# Patient Record
Sex: Female | Born: 1995 | Race: White | Hispanic: No | Marital: Single | Smoking: Never smoker
Health system: Southern US, Community
[De-identification: ages and names within clinical notes are randomized; demographics above are authoritative.]

## PROBLEM LIST (undated history)

## (undated) DIAGNOSIS — F329 Major depressive disorder, single episode, unspecified: Secondary | ICD-10-CM

## (undated) DIAGNOSIS — F32A Depression, unspecified: Secondary | ICD-10-CM

## (undated) DIAGNOSIS — G2581 Restless legs syndrome: Secondary | ICD-10-CM

## (undated) DIAGNOSIS — Z23 Encounter for immunization: Secondary | ICD-10-CM

## (undated) DIAGNOSIS — G90A Postural orthostatic tachycardia syndrome (POTS): Secondary | ICD-10-CM

## (undated) DIAGNOSIS — T8859XA Other complications of anesthesia, initial encounter: Secondary | ICD-10-CM

## (undated) DIAGNOSIS — R51 Headache: Secondary | ICD-10-CM

## (undated) DIAGNOSIS — R519 Headache, unspecified: Secondary | ICD-10-CM

## (undated) DIAGNOSIS — N12 Tubulo-interstitial nephritis, not specified as acute or chronic: Secondary | ICD-10-CM

## (undated) HISTORY — DX: Headache, unspecified: R51.9

## (undated) HISTORY — DX: Postural orthostatic tachycardia syndrome (POTS): G90.A

## (undated) HISTORY — DX: Headache: R51

## (undated) HISTORY — PX: NOSE SURGERY: SHX723

## (undated) HISTORY — DX: Other complications of anesthesia, initial encounter: T88.59XA

---
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
## 2015-03-23 ENCOUNTER — Encounter (HOSPITAL_COMMUNITY): Payer: Self-pay | Admitting: *Deleted

## 2015-03-23 ENCOUNTER — Inpatient Hospital Stay
Admission: AD | Admit: 2015-03-23 | Discharge: 2015-03-25 | DRG: 775 | Disposition: A | Discharge status: Home / Self Care | Payer: Medicaid Other | Source: Ambulatory Visit | Attending: Obstetrics & Gynecology | Admitting: Obstetrics & Gynecology

## 2015-03-23 DIAGNOSIS — IMO0002 Reserved for concepts with insufficient information to code with codable children: Secondary | ICD-10-CM | POA: Diagnosis present

## 2015-03-23 DIAGNOSIS — Z3A40 40 weeks gestation of pregnancy: Secondary | ICD-10-CM | POA: Diagnosis present

## 2015-03-23 DIAGNOSIS — O99344 Other mental disorders complicating childbirth: Secondary | ICD-10-CM | POA: Diagnosis present

## 2015-03-23 DIAGNOSIS — O9081 Anemia of the puerperium: Secondary | ICD-10-CM | POA: Diagnosis present

## 2015-03-23 DIAGNOSIS — F329 Major depressive disorder, single episode, unspecified: Secondary | ICD-10-CM | POA: Diagnosis present

## 2015-03-23 DIAGNOSIS — O480 Post-term pregnancy: Principal | ICD-10-CM | POA: Diagnosis present

## 2015-03-23 DIAGNOSIS — Z3403 Encounter for supervision of normal first pregnancy, third trimester: Secondary | ICD-10-CM | POA: Diagnosis present

## 2015-03-23 DIAGNOSIS — D649 Anemia, unspecified: Secondary | ICD-10-CM

## 2015-03-23 DIAGNOSIS — Z8659 Personal history of other mental and behavioral disorders: Secondary | ICD-10-CM

## 2015-03-23 HISTORY — DX: Major depressive disorder, single episode, unspecified: F32.9

## 2015-03-23 HISTORY — DX: Depression, unspecified: F32.A

## 2015-03-23 HISTORY — DX: Encounter for immunization: Z23

## 2015-03-23 HISTORY — DX: Tubulo-interstitial nephritis, not specified as acute or chronic: N12

## 2015-03-23 HISTORY — DX: Restless legs syndrome: G25.81

## 2015-03-23 LAB — TYPE AND SCREEN
ABO/RH(D): O POS
Antibody Screen: NEGATIVE

## 2015-03-23 LAB — CBC
HEMATOCRIT: 34.9 % — AB (ref 36.0–46.0)
Hemoglobin: 11.4 g/dL — ABNORMAL LOW (ref 12.0–15.0)
MCH: 26 pg (ref 26.0–34.0)
MCHC: 32.7 g/dL (ref 30.0–36.0)
MCV: 79.7 fL (ref 78.0–100.0)
PLATELETS: 270 10*3/uL (ref 150–400)
RBC: 4.38 MIL/uL (ref 3.87–5.11)
RDW: 14.5 % (ref 11.5–15.5)
WBC: 14.7 10*3/uL — ABNORMAL HIGH (ref 4.0–10.5)

## 2015-03-23 LAB — ABO/RH: ABO/RH(D): O POS

## 2015-03-23 LAB — OB RESULTS CONSOLE GBS: STREP GROUP B AG: NEGATIVE

## 2015-03-23 MED ORDER — ONDANSETRON HCL 4 MG/2ML IJ SOLN: 4.0000 mg | Freq: Four times a day (QID) | INTRAMUSCULAR | Status: DC | PRN

## 2015-03-23 MED ORDER — IBUPROFEN 600 MG PO TABS
600.0000 mg | ORAL_TABLET | Freq: Four times a day (QID) | ORAL | Status: DC
  Administered 2015-03-24 – 2015-03-25 (×6): 600 mg via ORAL
  Filled 2015-03-23 (×6): qty 1

## 2015-03-23 MED ORDER — ACETAMINOPHEN 325 MG PO TABS: 650.0000 mg | ORAL_TABLET | ORAL | Status: DC | PRN

## 2015-03-23 MED ORDER — LANOLIN HYDROUS EX OINT: TOPICAL_OINTMENT | CUTANEOUS | Status: DC | PRN

## 2015-03-23 MED ORDER — SENNOSIDES-DOCUSATE SODIUM 8.6-50 MG PO TABS
2.0000 | ORAL_TABLET | ORAL | Status: DC
  Administered 2015-03-24 (×2): 2 via ORAL
  Filled 2015-03-23 (×3): qty 2

## 2015-03-23 MED ORDER — DIPHENHYDRAMINE HCL 25 MG PO CAPS
25.0000 mg | ORAL_CAPSULE | Freq: Four times a day (QID) | ORAL | Status: DC | PRN
Start: 2015-03-23 — End: 2015-03-25

## 2015-03-23 MED ORDER — OXYTOCIN 40 UNITS IN LACTATED RINGERS INFUSION - SIMPLE MED
62.5000 mL/h | INTRAVENOUS | Status: DC
  Administered 2015-03-23: 62.5 mL/h via INTRAVENOUS
  Filled 2015-03-23: qty 1000

## 2015-03-23 MED ORDER — LIDOCAINE HCL (PF) 1 % IJ SOLN
30.0000 mL | INTRAMUSCULAR | Status: DC | PRN
  Administered 2015-03-23: 30 mL via SUBCUTANEOUS
  Filled 2015-03-23: qty 30

## 2015-03-23 MED ORDER — BUTORPHANOL TARTRATE 1 MG/ML IJ SOLN
1.0000 mg | INTRAMUSCULAR | Status: DC | PRN
  Administered 2015-03-23: 1 mg via INTRAVENOUS
  Filled 2015-03-23: qty 1

## 2015-03-23 MED ORDER — SODIUM CHLORIDE 0.9 % IV SOLN: 250.0000 mL | INTRAVENOUS | Status: DC | PRN

## 2015-03-23 MED ORDER — WITCH HAZEL-GLYCERIN EX PADS: 1.0000 "application " | MEDICATED_PAD | CUTANEOUS | Status: DC | PRN

## 2015-03-23 MED ORDER — OXYTOCIN BOLUS FROM INFUSION: 500.0000 mL | INTRAVENOUS | Status: DC

## 2015-03-23 MED ORDER — ZOLPIDEM TARTRATE 5 MG PO TABS: 5.0000 mg | ORAL_TABLET | Freq: Every evening | ORAL | Status: DC | PRN

## 2015-03-23 MED ORDER — OXYCODONE-ACETAMINOPHEN 5-325 MG PO TABS: 2.0000 | ORAL_TABLET | ORAL | Status: DC | PRN

## 2015-03-23 MED ORDER — TETANUS-DIPHTH-ACELL PERTUSSIS 5-2.5-18.5 LF-MCG/0.5 IM SUSP: 0.5000 mL | Freq: Once | INTRAMUSCULAR | Status: DC

## 2015-03-23 MED ORDER — SIMETHICONE 80 MG PO CHEW: 80.0000 mg | CHEWABLE_TABLET | ORAL | Status: DC | PRN

## 2015-03-23 MED ORDER — FLEET ENEMA 7-19 GM/118ML RE ENEM: 1.0000 | ENEMA | RECTAL | Status: DC | PRN

## 2015-03-23 MED ORDER — OXYCODONE-ACETAMINOPHEN 5-325 MG PO TABS
1.0000 | ORAL_TABLET | ORAL | Status: DC | PRN
  Administered 2015-03-24 (×2): 1 via ORAL
  Filled 2015-03-23 (×3): qty 1

## 2015-03-23 MED ORDER — LACTATED RINGERS IV SOLN: INTRAVENOUS | Status: DC

## 2015-03-23 MED ORDER — SODIUM CHLORIDE 0.9 % IJ SOLN: 3.0000 mL | Freq: Two times a day (BID) | INTRAMUSCULAR | Status: DC

## 2015-03-23 MED ORDER — ONDANSETRON HCL 4 MG PO TABS: 4.0000 mg | ORAL_TABLET | ORAL | Status: DC | PRN

## 2015-03-23 MED ORDER — LACTATED RINGERS IV SOLN: 500.0000 mL | INTRAVENOUS | Status: DC | PRN

## 2015-03-23 MED ORDER — SODIUM CHLORIDE 0.9 % IJ SOLN: 3.0000 mL | INTRAMUSCULAR | Status: DC | PRN

## 2015-03-23 MED ORDER — BENZOCAINE-MENTHOL 20-0.5 % EX AERO
1.0000 "application " | INHALATION_SPRAY | CUTANEOUS | Status: DC | PRN
  Administered 2015-03-24: 1 via TOPICAL
  Filled 2015-03-23: qty 56

## 2015-03-23 MED ORDER — CITRIC ACID-SODIUM CITRATE 334-500 MG/5ML PO SOLN: 30.0000 mL | ORAL | Status: DC | PRN

## 2015-03-23 MED ORDER — ONDANSETRON HCL 4 MG/2ML IJ SOLN: 4.0000 mg | INTRAMUSCULAR | Status: DC | PRN

## 2015-03-23 MED ORDER — DIBUCAINE 1 % RE OINT: 1.0000 "application " | TOPICAL_OINTMENT | RECTAL | Status: DC | PRN

## 2015-03-23 MED ORDER — OXYCODONE-ACETAMINOPHEN 5-325 MG PO TABS: 1.0000 | ORAL_TABLET | ORAL | Status: DC | PRN

## 2015-03-23 MED ORDER — PRENATAL MULTIVITAMIN CH
1.0000 | ORAL_TABLET | Freq: Every day | ORAL | Status: DC
  Administered 2015-03-24: 1 via ORAL
  Filled 2015-03-23: qty 1

## 2015-03-23 NOTE — Progress Notes (Signed)
Subjective: Slept at intervals since Stadol at 1408--still feels very tired, UCs still painful.  FOB, his mom, and patient's mom at bedside.  Tub inflated, not filled yet.  Objective: BP 126/76 mmHg  Pulse 83  Temp(Src) 98.3 F (36.8 C) (Oral)  Resp 18  Ht 5\' 4"  (1.626 m)  Wt 144 lb (65.318 kg)  BMI 24.71 kg/m2  SpO2 100%  LMP 04/16/2014      FHT: Category 1 UC:   regular, every 5 minutes SVE:   Dilation: 7 Effacement (%): 100 Station: 0 Exam by:: V. Syris Brookens,CNM  BBOW  Assessment:  Active labor Planning use of waterbirth tub   Plan: Discussed option of AROM--patient coping well with UCs, and labor progress is adequate.   Will recheck cervix within 2 hours--if no progress, will plan AROM.  Will observe at present. Family filling tub for patient use.  Nigel BridgemanLATHAM, Letita Prentiss CNM, [place] 03/23/2015, 4:28 PM

## 2015-03-23 NOTE — H&P (Signed)
Gina [name] is a 19 y.o. female, G1P0 at 4740 1/7 weeks, presenting for UCs q 5 min since 0430, with contractions q 10-15 min for "several days".  Denies leaking or bleeding, reports +FM.  Cervix was 1 cm, 80%, vtx, -2 on 03/21/15.    She was evaluated in MAU after arrival, with cervix initially 2 cm, 80%, vtx, -1.  NST reactive, with UCs q 5 min.  Patient was observed for approx 1 hour, with recheck of cervix as 3-4, 90%, vtx, -1, with slightly BBOW.  Patient is very fatigued, with minimal sleep over last 2 days.  She is accompanied by her partner and her mother.  Patient does hope to use waterbirth tub for labor and/or delivery.  She attended class and signed consents x 2 on 03/09/15.  Patient Active Problem List   Diagnosis Date Noted  . Normal labor 03/23/2015  . Teen pregnancy 03/23/2015  . H/O: depression--dx 2015 03/23/2015  . Headache in pregnancy 10/21/2014    History of present pregnancy: Patient entered care at 8 2/7 weeks.   EDC of 03/22/15 was established by US at 8 2/7 weeks.   Anatomy scan:  18 3/7 weeks, with normal findings and an posterior fundal placenta.   Additional US evaluations:   38 6/7 weeks due to S<D:  EFW 3019 gm, 6+14, 25.6%ile, AFI 13.89, 50%ile, vtx. Significant prenatal events:  URI sx at 10 weeks.  EDC established by 8 2/7 week US.  Some weakness and fatigue at 14 3/7 weeks, with HA.  Recommended to take Ibuprophen and Magnesium.  Neuro referral planned around 16 weeks due to persistence of HAs, but patient did not keep appt.  Normal quad screen.  Hgb 10.9 at 28 weeks.  Cold sx at 35 weeks, with fatigue.  Attended WB class, signed consents x 2 on 03/09/15. Last evaluation:  03/21/15--cervix 1 cm, 80%, vtx, -2, discussed induction.  Patient hoped to avoid induction, scheduled for BPP/AFI and ROB for postdates.  Had UCs q 10-15 min for several days at that point.  OB History    Gravida Para Term Preterm AB TAB SAB Ectopic Multiple Living   1         0     Past  Medical History  Diagnosis Date  . Headache   . Restless leg syndrome   . Depression   . Pyelonephritis   . Need for varicella vaccine   History reviewed. No pertinent past surgical history.   Family History: family history includes Anxiety disorder in her mother. There is no history of Migraines.   Social History:  reports that she has never smoked. She has never used smokeless tobacco. She reports that she does not drink alcohol or use illicit drugs.  Patient is Caucasian, of the [place] faith, completing high school.  FOB is involved and supportive, with patient's mother also present and supportive.   Prenatal Transfer Tool  Maternal Diabetes: No Genetic Screening: Normal Quad screen Maternal Ultrasounds/Referrals: Normal Fetal Ultrasounds or other Referrals:  None Maternal Substance Abuse:  No Significant Maternal Medications:  None Significant Maternal Lab Results: Lab values include: Group B Strep negative  TDAP NA Flu NA  ROS:  Contractions, mucus d/c, +FM  No Known Allergies   Dilation: 3.5 Effacement (%): 80 Exam by:: Manfred ArchV. Marvelene Stoneberg, CNM Blood pressure 109/72, pulse 90, temperature 98.4 F (36.9 C), temperature source Oral, resp. rate 18, height 5\' 4"  (1.626 m), weight 144 lb (65.318 kg), last menstrual period 04/16/2014, SpO2 100 %.  Appears very fatigued, lips dry. Chest clear Heart RRR without murmur Abd gravid, NT, FH 38 cm Pelvic: As above, BBOW Ext: WNL  FHR: Category 1 UCs:  q 5 min, mild/moderate  Prenatal labs: ABO, Rh: O/Positive/-- (08/13 0000)O+ Antibody: Negative (08/13 0000)Neg Rubella:   Immune RPR: Nonreactive (08/13 0000) NR HBsAg: Negative (08/13 0000) Neg HIV: Non-reactive (08/13 0000) NR GBS: Negative (04/06 0000) Sickle cell/Hgb electrophoresis:  NA Pap:  NA GC:  Negative 07/30/95, 09/15/14, 02/21/15 Chlamydia:  Negative 07/30/95, 09/15/14, 02/21/15. Genetic screenings:  Normal Quad screen Glucola:  WNL Other:   Hgb at 13.7 at NOB, 10.9   at 28 weeks Varicella non-immune TSH WNL Urine culture 100K mixed species 07/29/14.    Assessment/Plan: IUP at 40 1/7 weeks Early labor, with prolonged prodromal labor GBS negative Desires use of waterbirth tub for labor/birth--consents x 2 in [place] chart Hx depression Hx frequent HAs  Plan: Admit to [facility] per consult with Dr. Sallye Ober Routine [facility] orders OK for use of waterbirth tub Intermittent monitoring Reviewed issue of prolonged prodromal labor, with significant fatigue.  Offered pain med for rest, patient is agreeable with that plan. Saline lock IV, with hydration during pain med administration. Pain med/epidural prn Tub use as labor advances Pain med/epidural prn as desired.   Teiara Baria, VICKICNM, [place] 03/23/2015, 2:14 PM

## 2015-03-23 NOTE — Progress Notes (Signed)
Delivery of live viable female by V. Emilee HeroLatham, CNM APGARs [phone]

## 2015-03-23 NOTE — MAU Note (Signed)
Has been contracting for 4 days, kicked in and regular since 0430 this morning.  No bleeding or leaking.  Was 1 cm when last checked. No problems with preg.

## 2015-03-24 LAB — RPR: RPR Ser Ql: NONREACTIVE

## 2015-03-24 LAB — CBC
HEMATOCRIT: 30 % — AB (ref 36.0–46.0)
Hemoglobin: 9.8 g/dL — ABNORMAL LOW (ref 12.0–15.0)
MCH: 25.8 pg — ABNORMAL LOW (ref 26.0–34.0)
MCHC: 32.7 g/dL (ref 30.0–36.0)
MCV: 78.9 fL (ref 78.0–100.0)
Platelets: 247 10*3/uL (ref 150–400)
RBC: 3.8 MIL/uL — ABNORMAL LOW (ref 3.87–5.11)
RDW: 14.6 % (ref 11.5–15.5)
WBC: 13.3 10*3/uL — AB (ref 4.0–10.5)

## 2015-03-24 LAB — HIV ANTIBODY (ROUTINE TESTING W REFLEX): HIV Screen 4th Generation wRfx: NONREACTIVE

## 2015-03-24 MED ORDER — FERROUS SULFATE 325 (65 FE) MG PO TABS
325.0000 mg | ORAL_TABLET | Freq: Every day | ORAL | Status: DC
  Administered 2015-03-24 – 2015-03-25 (×2): 325 mg via ORAL
  Filled 2015-03-24 (×2): qty 1

## 2015-03-24 NOTE — Progress Notes (Signed)
Subjective: Postpartum Day 1: Vaginal delivery, 2nd degree perineal laceration; left periurethral laceration, hemostatic, no repair required. Patient up ad lib, reports no syncope or dizziness. Feeding:  Breast Contraceptive plan:  Micronor  Objective: Vital signs in last 24 hours: Temp:  [98.3 F (36.8 C)-99.7 F (37.6 C)] 98.3 F (36.8 C) (04/07 0624) Pulse Rate:  [74-115] 74 (04/07 0641) Resp:  [18-20] 20 (04/07 0624) BP: (88-133)/(54-85) 106/64 mmHg (04/07 0641) SpO2:  [99 %-100 %] 100 % (04/06 1114) Weight:  [144 lb (65.318 kg)] 144 lb (65.318 kg) (04/06 1340)   Orthostatics stable.  Physical Exam:  General: alert Lochia: appropriate Uterine Fundus: firm Perineum: healing well DVT Evaluation: No evidence of DVT seen on physical exam. Negative Homan's sign.  CBC Latest Ref Rng 03/24/2015 03/23/2015 12/04/2013  WBC 4.0 - 10.5 K/uL 13.3(H) 14.7(H) 6.2  Hemoglobin 12.0 - 15.0 g/dL 1.6(X9.8(L) 11.4(L) 12.8  Hematocrit 36.0 - 46.0 % 30.0(L) 34.9(L) 41.2  Platelets 150 - 400 K/uL 247 270 -    Assessment/Plan: Status post vaginal delivery day 1. Mild anemia without hemodynamic instability Stable Continue current care. Fe q day. Plan for discharge tomorrow    Nyra CapesLATHAM, VICKICNM 03/24/2015, 8:11 AM

## 2015-03-24 NOTE — Progress Notes (Signed)
UR chart review completed.  

## 2015-03-24 NOTE — Progress Notes (Signed)
Clinical Social Work Department PSYCHOSOCIAL ASSESSMENT - MATERNAL/CHILD 03/24/2015  Patient:  Gina [name], Gina [name]  Account Number:  [id number]  Admit Date:  03/23/2015  Ardine Eng Name:   [place] Worker:  Lucita Ferrara, CLINICAL SOCIAL WORKER   Date/Time:  03/24/2015 11:15 AM  Date Referred:  03/23/2015   Referral source  Central Nursery     Referred reason  Depression/Anxiety   Other referral source:    I:  FAMILY / [place] legal guardian:  PARENT  Guardian - Name Guardian - Age Guardian - Address  [address] [place], [place] [address]  Ewing Schlein  same as above   Other household support members/support persons Other support:   MOB and FOB endorsed having numerous family members that live within close proximity of their apartment.   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   Financial resources:  Self Pay If Medicaid - County:  [place] Other  [place] / Grade:  N/A Maternity Glass blower/designer / Child Services Coordination / Early Interventions:   None reported  Cultural issues impacting care:   None reported    III  STRENGTHS Strengths  Adequate Resources  Home prepared for Child (including basic supplies)  Supportive family/friends   Strength comment:    IV  RISK FACTORS AND CURRENT PROBLEMS Current Problem:  YES   Risk Factor & Current Problem Patient Issue Family Issue Risk Factor / Current Problem Comment  Mental Illness Y N MOB presents with history of depression since childhood. MOB stated that she sough out treatment 2 years ago, but stopped taking medications shortly afterwards due to "not liking it". MOB denied any symptoms pre-pregnancy or during pregnancy.  Adjustment to Motherhood Y N MOB continues to adjust to life transition of  becoming a mother at a young age.    V  SOCIAL WORK ASSESSMENT CSW met with MOB due to history of depression.  MOB presented as easily engaged and receptive to the visit. The FOB and the PGM were also present for the visit with MOB's consent.  MOB displayed an appropriate range in affect and was in a pleasant mood.  CSW unable to assess for infant bonding since PGM was holding the infant, but MOB was noted to be smiling as she discussed how she feels as she transitions to becoming a mother.   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  She discussed belief that this previous experience has helped her to increase confidence/comfort with caring for an infant.  She shared that she also has positive supports at home (identified numerous family members) who she can call for help if needed.  MOB discussed that she and the FOB have their home prepared for the infant and are looking forward to the transition to parenthood.  She shared that despite mentally preparing for the entire pregnancy, it continues to feel surreal that the infant has been born.  CSW normalized and continued to assist the MOB explore feelings that accompany the transition to the motherhood.  MOB shared that she is supported by her employer (has maternity leave), and stated that she will be taking online classes this semester. She stated that she is concerned about her transition back to face-to-face classes this summer.  Per MOB, she knows that the infant will be well taken care of, but she would rather remain with the infant. She presented as receptive as CSW explore the temporal nature of these feelings and discussing how the separation during class is "brief" in comparison to the big picture of the infant's life.   MOB acknowledged history of depression.  She presents with insight on symptoms and awareness of reasons to seek out treatment.  MOB stated that she had  depression/anxiety since childhood.  She stated that she finally "sought out" help 2 years ago since she had low energy, low motivation, and was emotional or numb to feelings.  She stated that she was prescribed an anti-depressant (unable to recall name) but did not find it to be effective.  She discussed that she then moved out of her parents' home, started to talk to others about her feelings, and listened to music in order to help her to reduce depression.  MOB denied any symptoms since then, and denied any mood symptoms during the pregnancy.  She presented as receptive to exploring how these previous learned coping skills can be applied to the postpartum period.  She expressed confidence in her ability to reach out to medical providers if needed in the future, and was able to articulate numerous symptoms that would cause her to reach out to providers.  She acknowledged education on postpartum depression, and verbalized understanding of the spectrum of symptoms that may occur.  She agreed to contact her doctor if she notes symptoms.  MOB did not present as nervous or overwhelmed as she becomes a mother.  She also did not indicate a belief that her mental health is a chronic/present problem.   MOB and family verbalized appreciation for the visit. They denied questions or concerns at this time.  MOB agreed to contact CSW if needs arise.   VI SOCIAL WORK PLAN Social Work Therapist, art  No Further Intervention Required / No Barriers to Discharge   Type of pt/family education:   Postpartum depression   If child protective services report - county:  N/A If child protective services report - date:  N/A Information/referral to community resources comment:   MOB denied current needs.   Other social work plan:   CSW to follow up PRN.

## 2015-03-24 NOTE — Lactation Note (Signed)
This note was copied from the chart of Gina Chemical engineerVaeda Chittum. Lactation Consultation Note Baby sleeping soundly. Mom resting. Stating baby not getting deep latch.  Mom has small nipples, very compressible. Mom encouraged to feed baby 8-12 times/24 hours and with feeding cues. Referred to Baby and Me Book in Breastfeeding section Pg. 22-23 for position options and Proper latch demonstration. Educated about newborn behavior.  Mom encouraged to do skin-to-skin.Encouraged to call for assistance if needed and to verify proper latch.WH/LC brochure given w/resources, support groups and LC services. Discussed positioning, and obtaining deep latch. Patient Name: Gina [name]   HQION'GToday's Date: 03/24/2015 Reason for consult: Initial assessment   Maternal Data    Feeding    LATCH Score/Interventions       Type of Nipple: Everted at rest and after stimulation  Comfort (Breast/Nipple): Filling, red/small blisters or bruises, mild/mod discomfort  Problem noted: Mild/Moderate discomfort Interventions (Mild/moderate discomfort): Hand massage;Hand expression        Lactation Tools Discussed/Used     Consult Status Consult Status: Follow-up Date: 03/24/15 (in pm) Follow-up type: In-patient    Charyl DancerCARVER, Nohelia Valenza G 03/24/2015, 6:41 AM

## 2015-03-25 DIAGNOSIS — D649 Anemia, unspecified: Secondary | ICD-10-CM

## 2015-03-25 MED ORDER — IBUPROFEN 600 MG PO TABS: 600.0000 mg | ORAL_TABLET | Freq: Four times a day (QID) | ORAL | Status: DC

## 2015-03-25 MED ORDER — NORETHINDRONE 0.35 MG PO TABS: 1.0000 | ORAL_TABLET | Freq: Every day | ORAL | Status: DC

## 2015-03-25 MED ORDER — FERROUS SULFATE 325 (65 FE) MG PO TABS: 325.0000 mg | ORAL_TABLET | Freq: Every day | ORAL | Status: DC

## 2015-03-25 MED ORDER — OXYCODONE-ACETAMINOPHEN 5-325 MG PO TABS
1.0000 | ORAL_TABLET | ORAL | Status: DC | PRN
Start: 2015-03-25 — End: 2015-06-22

## 2015-03-25 NOTE — Discharge Instructions (Signed)
Contraception Choices Contraception (birth control) is the use of any methods or devices to prevent pregnancy. Below are some methods to help avoid pregnancy. HORMONAL METHODS   Contraceptive implant. This is a thin, plastic tube containing progesterone hormone. It does not contain estrogen hormone. Your health care provider inserts the tube in the inner part of the upper arm. The tube can remain in place for up to 3 years. After 3 years, the implant must be removed. The implant prevents the ovaries from releasing an egg (ovulation), thickens the cervical mucus to prevent sperm from entering the uterus, and thins the lining of the inside of the uterus.  Progesterone-only injections. These injections are given every 3 months by your health care provider to prevent pregnancy. This synthetic progesterone hormone stops the ovaries from releasing eggs. It also thickens cervical mucus and changes the uterine lining. This makes it harder for sperm to survive in the uterus.  Birth control pills. These pills contain estrogen and progesterone hormone. They work by preventing the ovaries from releasing eggs (ovulation). They also cause the cervical mucus to thicken, preventing the sperm from entering the uterus. Birth control pills are prescribed by a health care provider.Birth control pills can also be used to treat heavy periods.  Minipill. This type of birth control pill contains only the progesterone hormone. They are taken every day of each month and must be prescribed by your health care provider.  Birth control patch. The patch contains hormones similar to those in birth control pills. It must be changed once a week and is prescribed by a health care provider.  Vaginal ring. The ring contains hormones similar to those in birth control pills. It is left in the vagina for 3 weeks, removed for 1 week, and then a new one is put back in place. The patient must be comfortable inserting and removing the ring  from the vagina.A health care provider's prescription is necessary.  Emergency contraception. Emergency contraceptives prevent pregnancy after unprotected sexual intercourse. This pill can be taken right after sex or up to 5 days after unprotected sex. It is most effective the sooner you take the pills after having sexual intercourse. Most emergency contraceptive pills are available without a prescription. Check with your pharmacist. Do not use emergency contraception as your only form of birth control. BARRIER METHODS   Female condom. This is a thin sheath (latex or rubber) that is worn over the penis during sexual intercourse. It can be used with spermicide to increase effectiveness.  Female condom. This is a soft, loose-fitting sheath that is put into the vagina before sexual intercourse.  Diaphragm. This is a soft, latex, dome-shaped barrier that must be fitted by a health care provider. It is inserted into the vagina, along with a spermicidal jelly. It is inserted before intercourse. The diaphragm should be left in the vagina for 6 to 8 hours after intercourse.  Cervical cap. This is a round, soft, latex or plastic cup that fits over the cervix and must be fitted by a health care provider. The cap can be left in place for up to 48 hours after intercourse.  Sponge. This is a soft, circular piece of polyurethane foam. The sponge has spermicide in it. It is inserted into the vagina after wetting it and before sexual intercourse.  Spermicides. These are chemicals that kill or block sperm from entering the cervix and uterus. They come in the form of creams, jellies, suppositories, foam, or tablets. They do not require a  prescription. They are inserted into the vagina with an applicator before having sexual intercourse. The process must be repeated every time you have sexual intercourse. INTRAUTERINE CONTRACEPTION  Intrauterine device (IUD). This is a T-shaped device that is put in a woman's uterus  during a menstrual period to prevent pregnancy. There are 2 types:  Copper IUD. This type of IUD is wrapped in copper wire and is placed inside the uterus. Copper makes the uterus and fallopian tubes produce a fluid that kills sperm. It can stay in place for 10 years.  Hormone IUD. This type of IUD contains the hormone progestin (synthetic progesterone). The hormone thickens the cervical mucus and prevents sperm from entering the uterus, and it also thins the uterine lining to prevent implantation of a fertilized egg. The hormone can weaken or kill the sperm that get into the uterus. It can stay in place for 3-5 years, depending on which type of IUD is used. PERMANENT METHODS OF CONTRACEPTION  Female tubal ligation. This is when the woman's fallopian tubes are surgically sealed, tied, or blocked to prevent the egg from traveling to the uterus.  Hysteroscopic sterilization. This involves placing a small coil or insert into each fallopian tube. Your doctor uses a technique called hysteroscopy to do the procedure. The device causes scar tissue to form. This results in permanent blockage of the fallopian tubes, so the sperm cannot fertilize the egg. It takes about 3 months after the procedure for the tubes to become blocked. You must use another form of birth control for these 3 months.  Female sterilization. This is when the female has the tubes that carry sperm tied off (vasectomy).This blocks sperm from entering the vagina during sexual intercourse. After the procedure, the man can still ejaculate fluid (semen). NATURAL PLANNING METHODS  Natural family planning. This is not having sexual intercourse or using a barrier method (condom, diaphragm, cervical cap) on days the woman could become pregnant.  Calendar method. This is keeping track of the length of each menstrual cycle and identifying when you are fertile.  Ovulation method. This is avoiding sexual intercourse during ovulation.  Symptothermal  method. This is avoiding sexual intercourse during ovulation, using a thermometer and ovulation symptoms.  Post-ovulation method. This is timing sexual intercourse after you have ovulated. Regardless of which type or method of contraception you choose, it is important that you use condoms to protect against the transmission of sexually transmitted infections (STIs). Talk with your health care provider about which form of contraception is most appropriate for you. Document Released: 12/03/2005 Document Revised: 12/08/2013 Document Reviewed: 05/28/2013 [facility] Patient Information 2015 [place], [place]. This information is not intended to replace advice given to you by your health care provider. Make sure you discuss any questions you have with your health care provider. Breastfeeding Deciding to breastfeed is one of the best choices you can make for you and your baby. A change in hormones during pregnancy causes your breast tissue to grow and increases the number and size of your milk ducts. These hormones also allow proteins, sugars, and fats from your blood supply to make breast milk in your milk-producing glands. Hormones prevent breast milk from being released before your baby is born as well as prompt milk flow after birth. Once breastfeeding has begun, thoughts of your baby, as well as his or her sucking or crying, can stimulate the release of milk from your milk-producing glands.  BENEFITS OF BREASTFEEDING For Your Baby  Your first milk (colostrum) helps your  baby's digestive system function better.   There are antibodies in your milk that help your baby fight off infections.   Your baby has a lower incidence of asthma, allergies, and sudden infant death syndrome.   The nutrients in breast milk are better for your baby than infant formulas and are designed uniquely for your baby's needs.   Breast milk improves your baby's brain development.   Your baby is less likely to develop other  conditions, such as childhood obesity, asthma, or type 2 diabetes mellitus.  For You   Breastfeeding helps to create a very special bond between you and your baby.   Breastfeeding is convenient. Breast milk is always available at the correct temperature and costs nothing.   Breastfeeding helps to burn calories and helps you lose the weight gained during pregnancy.   Breastfeeding makes your uterus contract to its prepregnancy size faster and slows bleeding (lochia) after you give birth.   Breastfeeding helps to lower your risk of developing type 2 diabetes mellitus, osteoporosis, and breast or ovarian cancer later in life. SIGNS THAT YOUR BABY IS HUNGRY Early Signs of Hunger  Increased alertness or activity.  Stretching.  Movement of the head from side to side.  Movement of the head and opening of the mouth when the corner of the mouth or cheek is stroked (rooting).  Increased sucking sounds, smacking lips, cooing, sighing, or squeaking.  Hand-to-mouth movements.  Increased sucking of fingers or hands. Late Signs of Hunger  Fussing.  Intermittent crying. Extreme Signs of Hunger Signs of extreme hunger will require calming and consoling before your baby will be able to breastfeed successfully. Do not wait for the following signs of extreme hunger to occur before you initiate breastfeeding:   Restlessness.  A loud, strong cry.   Screaming. BREASTFEEDING BASICS Breastfeeding Initiation  Find a comfortable place to sit or lie down, with your neck and back well supported.  Place a pillow or rolled up blanket under your baby to bring him or her to the level of your breast (if you are seated). Nursing pillows are specially designed to help support your arms and your baby while you breastfeed.  Make sure that your baby's abdomen is facing your abdomen.   Gently massage your breast. With your fingertips, massage from your chest wall toward your nipple in a circular  motion. This encourages milk flow. You may need to continue this action during the feeding if your milk flows slowly.  Support your breast with 4 fingers underneath and your thumb above your nipple. Make sure your fingers are well away from your nipple and your baby's mouth.   Stroke your baby's lips gently with your finger or nipple.   When your baby's mouth is open wide enough, quickly bring your baby to your breast, placing your entire nipple and as much of the colored area around your nipple (areola) as possible into your baby's mouth.   More areola should be visible above your baby's upper lip than below the lower lip.   Your baby's tongue should be between his or her lower gum and your breast.   Ensure that your baby's mouth is correctly positioned around your nipple (latched). Your baby's lips should create a seal on your breast and be turned out (everted).  It is common for your baby to suck about 2-3 minutes in order to start the flow of breast milk. Latching Teaching your baby how to latch on to your breast properly is very important.  An improper latch can cause nipple pain and decreased milk supply for you and poor weight gain in your baby. Also, if your baby is not latched onto your nipple properly, he or she may swallow some air during feeding. This can make your baby fussy. Burping your baby when you switch breasts during the feeding can help to get rid of the air. However, teaching your baby to latch on properly is still the best way to prevent fussiness from swallowing air while breastfeeding. Signs that your baby has successfully latched on to your nipple:    Silent tugging or silent sucking, without causing you pain.   Swallowing heard between every 3-4 sucks.    Muscle movement above and in front of his or her ears while sucking.  Signs that your baby has not successfully latched on to nipple:   Sucking sounds or smacking sounds from your baby while  breastfeeding.  Nipple pain. If you think your baby has not latched on correctly, slip your finger into the corner of your baby's mouth to break the suction and place it between your baby's gums. Attempt breastfeeding initiation again. Signs of Successful Breastfeeding Signs from your baby:   A gradual decrease in the number of sucks or complete cessation of sucking.   Falling asleep.   Relaxation of his or her body.   Retention of a small amount of milk in his or her mouth.   Letting go of your breast by himself or herself. Signs from you:  Breasts that have increased in firmness, weight, and size 1-3 hours after feeding.   Breasts that are softer immediately after breastfeeding.  Increased milk volume, as well as a change in milk consistency and color by the fifth day of breastfeeding.   Nipples that are not sore, cracked, or bleeding. Signs That Your Pecola LeisureBaby is Getting Enough Milk  Wetting at least 3 diapers in a 24-hour period. The urine should be clear and pale yellow by age 14 days.  At least 3 stools in a 24-hour period by age 14 days. The stool should be soft and yellow.  At least 3 stools in a 24-hour period by age 96 days. The stool should be seedy and yellow.  No loss of weight greater than 10% of birth weight during the first 723 days of age.  Average weight gain of 4-7 ounces (113-198 g) per week after age 43 days.  Consistent daily weight gain by age 14 days, without weight loss after the age of 2 weeks. After a feeding, your baby may spit up a small amount. This is common. BREASTFEEDING FREQUENCY AND DURATION Frequent feeding will help you make more milk and can prevent sore nipples and breast engorgement. Breastfeed when you feel the need to reduce the fullness of your breasts or when your baby shows signs of hunger. This is called "breastfeeding on demand." Avoid introducing a pacifier to your baby while you are working to establish breastfeeding (the first 4-6  weeks after your baby is born). After this time you may choose to use a pacifier. Research has shown that pacifier use during the first year of a baby's life decreases the risk of sudden infant death syndrome (SIDS). Allow your baby to feed on each breast as long as he or she wants. Breastfeed until your baby is finished feeding. When your baby unlatches or falls asleep while feeding from the first breast, offer the second breast. Because newborns are often sleepy in the first few weeks of  life, you may need to awaken your baby to get him or her to feed. Breastfeeding times will vary from baby to baby. However, the following rules can serve as a guide to help you ensure that your baby is properly fed:  Newborns (babies 46 weeks of age or younger) may breastfeed every 1-3 hours.  Newborns should not go longer than 3 hours during the day or 5 hours during the night without breastfeeding.  You should breastfeed your baby a minimum of 8 times in a 24-hour period until you begin to introduce solid foods to your baby at around 46 months of age. BREAST MILK PUMPING Pumping and storing breast milk allows you to ensure that your baby is exclusively fed your breast milk, even at times when you are unable to breastfeed. This is especially important if you are going back to work while you are still breastfeeding or when you are not able to be present during feedings. Your lactation consultant can give you guidelines on how long it is safe to store breast milk.  A breast pump is a machine that allows you to pump milk from your breast into a sterile bottle. The pumped breast milk can then be stored in a refrigerator or freezer. Some breast pumps are operated by hand, while others use electricity. Ask your lactation consultant which type will work best for you. Breast pumps can be purchased, but some hospitals and breastfeeding support groups lease breast pumps on a monthly basis. A lactation consultant can teach you how  to hand express breast milk, if you prefer not to use a pump.  CARING FOR YOUR BREASTS WHILE YOU BREASTFEED Nipples can become dry, cracked, and sore while breastfeeding. The following recommendations can help keep your breasts moisturized and healthy:  Avoid using soap on your nipples.   Wear a supportive bra. Although not required, special nursing bras and tank tops are designed to allow access to your breasts for breastfeeding without taking off your entire bra or top. Avoid wearing underwire-style bras or extremely tight bras.  Air dry your nipples for 3-60minutes after each feeding.   Use only cotton bra pads to absorb leaked breast milk. Leaking of breast milk between feedings is normal.   Use lanolin on your nipples after breastfeeding. Lanolin helps to maintain your skin's normal moisture barrier. If you use pure lanolin, you do not need to wash it off before feeding your baby again. Pure lanolin is not toxic to your baby. You may also hand express a few drops of breast milk and gently massage that milk into your nipples and allow the milk to air dry. In the first few weeks after giving birth, some women experience extremely full breasts (engorgement). Engorgement can make your breasts feel heavy, warm, and tender to the touch. Engorgement peaks within 3-5 days after you give birth. The following recommendations can help ease engorgement:  Completely empty your breasts while breastfeeding or pumping. You may want to start by applying warm, moist heat (in the shower or with warm water-soaked hand towels) just before feeding or pumping. This increases circulation and helps the milk flow. If your baby does not completely empty your breasts while breastfeeding, pump any extra milk after he or she is finished.  Wear a snug bra (nursing or regular) or tank top for 1-2 days to signal your body to slightly decrease milk production.  Apply ice packs to your breasts, unless this is too  uncomfortable for you.  Make sure that  your baby is latched on and positioned properly while breastfeeding. If engorgement persists after 48 hours of following these recommendations, contact your health care provider or a Advertising copywriter. OVERALL HEALTH CARE RECOMMENDATIONS WHILE BREASTFEEDING  Eat healthy foods. Alternate between meals and snacks, eating 3 of each per day. Because what you eat affects your breast milk, some of the foods may make your baby more irritable than usual. Avoid eating these foods if you are sure that they are negatively affecting your baby.  Drink milk, fruit juice, and water to satisfy your thirst (about 10 glasses a day).   Rest often, relax, and continue to take your prenatal vitamins to prevent fatigue, stress, and anemia.  Continue breast self-awareness checks.  Avoid chewing and smoking tobacco.  Avoid alcohol and drug use. Some medicines that may be harmful to your baby can pass through breast milk. It is important to ask your health care provider before taking any medicine, including all over-the-counter and prescription medicine as well as vitamin and herbal supplements. It is possible to become pregnant while breastfeeding. If birth control is desired, ask your health care provider about options that will be safe for your baby. SEEK MEDICAL CARE IF:   You feel like you want to stop breastfeeding or have become frustrated with breastfeeding.  You have painful breasts or nipples.  Your nipples are cracked or bleeding.  Your breasts are red, tender, or warm.  You have a swollen area on either breast.  You have a fever or chills.  You have nausea or vomiting.  You have drainage other than breast milk from your nipples.  Your breasts do not become full before feedings by the fifth day after you give birth.  You feel sad and depressed.  Your baby is too sleepy to eat well.  Your baby is having trouble sleeping.   Your baby is wetting  less than 3 diapers in a 24-hour period.  Your baby has less than 3 stools in a 24-hour period.  Your baby's skin or the white part of his or her eyes becomes yellow.   Your baby is not gaining weight by 8 days of age. SEEK IMMEDIATE MEDICAL CARE IF:   Your baby is overly tired (lethargic) and does not want to wake up and feed.  Your baby develops an unexplained fever. Document Released: 12/03/2005 Document Revised: 12/08/2013 Document Reviewed: 05/27/2013 [facility] Patient Information 2015 [place], [place]. This information is not intended to replace advice given to you by your health care provider. Make sure you discuss any questions you have with your health care provider. Anemia, Nonspecific Anemia is a condition in which the concentration of red blood cells or hemoglobin in the blood is below normal. Hemoglobin is a substance in red blood cells that carries oxygen to the tissues of the body. Anemia results in not enough oxygen reaching these tissues.  CAUSES  Common causes of anemia include:   Excessive bleeding. Bleeding may be internal or external. This includes excessive bleeding from periods (in women) or from the intestine.   Poor nutrition.   Chronic kidney, thyroid, and liver disease.  Bone marrow disorders that decrease red blood cell production.  Cancer and treatments for cancer.  HIV, AIDS, and their treatments.  Spleen problems that increase red blood cell destruction.  Blood disorders.  Excess destruction of red blood cells due to infection, medicines, and autoimmune disorders. SIGNS AND SYMPTOMS   Minor weakness.   Dizziness.   Headache.  Palpitations.  Shortness of breath, especially with exercise.   Paleness.  Cold sensitivity.  Indigestion.  Nausea.  Difficulty sleeping.  Difficulty concentrating. Symptoms may occur suddenly or they may develop slowly.  DIAGNOSIS  Additional blood tests are often needed. These help your health  care provider determine the best treatment. Your health care provider will check your stool for blood and look for other causes of blood loss.  TREATMENT  Treatment varies depending on the cause of the anemia. Treatment can include:   Supplements of iron, vitamin B12, or folic acid.   Hormone medicines.   A blood transfusion. This may be needed if blood loss is severe.   Hospitalization. This may be needed if there is significant continual blood loss.   Dietary changes.  Spleen removal. HOME CARE INSTRUCTIONS Keep all follow-up appointments. It often takes many weeks to correct anemia, and having your health care provider check on your condition and your response to treatment is very important. SEEK IMMEDIATE MEDICAL CARE IF:   You develop extreme weakness, shortness of breath, or chest pain.   You become dizzy or have trouble concentrating.  You develop heavy vaginal bleeding.   You develop a rash.   You have bloody or black, tarry stools.   You faint.   You vomit up blood.   You vomit repeatedly.   You have abdominal pain.  You have a fever or persistent symptoms for more than 2-3 days.   You have a fever and your symptoms suddenly get worse.   You are dehydrated.  MAKE SURE YOU:  Understand these instructions.  Will watch your condition.  Will get help right away if you are not doing well or get worse. Document Released: 01/10/2005 Document Revised: 08/05/2013 Document Reviewed: 05/29/2013 [facility] Patient Information 2015 [place], [place]. This information is not intended to replace advice given to you by your health care provider. Make sure you discuss any questions you have with your health care provider. Postpartum Care After Vaginal Delivery After you deliver your newborn (postpartum period), the usual stay in the hospital is 24-72 hours. If there were problems with your labor or delivery, or if you have other medical problems, you might be in  the hospital longer.  While you are in the hospital, you will receive help and instructions on how to care for yourself and your newborn during the postpartum period.  While you are in the hospital:  Be sure to tell your nurses if you have pain or discomfort, as well as where you feel the pain and what makes the pain worse.  If you had an incision made near your vagina (episiotomy) or if you had some tearing during delivery, the nurses may put ice packs on your episiotomy or tear. The ice packs may help to reduce the pain and swelling.  If you are breastfeeding, you may feel uncomfortable contractions of your uterus for a couple of weeks. This is normal. The contractions help your uterus get back to normal size.  It is normal to have some bleeding after delivery.  For the first 1-3 days after delivery, the flow is red and the amount may be similar to a period.  It is common for the flow to start and stop.  In the first few days, you may pass some small clots. Let your nurses know if you begin to pass large clots or your flow increases.  Do not  flush blood clots down the toilet before having the nurse look at them.  During the next 3-10 days after delivery, your flow should become more watery and pink or brown-tinged in color.  Ten to fourteen days after delivery, your flow should be a small amount of yellowish-white discharge.  The amount of your flow will decrease over the first few weeks after delivery. Your flow may stop in 6-8 weeks. Most women have had their flow stop by 12 weeks after delivery.  You should change your sanitary pads frequently.  Wash your hands thoroughly with soap and water for at least 20 seconds after changing pads, using the toilet, or before holding or feeding your newborn.  You should feel like you need to empty your bladder within the first 6-8 hours after delivery.  In case you become weak, lightheaded, or faint, call your nurse before you get out of bed  for the first time and before you take a shower for the first time.  Within the first few days after delivery, your breasts may begin to feel tender and full. This is called engorgement. Breast tenderness usually goes away within 48-72 hours after engorgement occurs. You may also notice milk leaking from your breasts. If you are not breastfeeding, do not stimulate your breasts. Breast stimulation can make your breasts produce more milk.  Spending as much time as possible with your newborn is very important. During this time, you and your newborn can feel close and get to know each other. Having your newborn stay in your room (rooming in) will help to strengthen the bond with your newborn. It will give you time to get to know your newborn and become comfortable caring for your newborn.  Your hormones change after delivery. Sometimes the hormone changes can temporarily cause you to feel sad or tearful. These feelings should not last more than a few days. If these feelings last longer than that, you should talk to your caregiver.  If desired, talk to your caregiver about methods of family planning or contraception.  Talk to your caregiver about immunizations. Your caregiver may want you to have the following immunizations before leaving the hospital:  Tetanus, diphtheria, and pertussis (Tdap) or tetanus and diphtheria (Td) immunization. It is very important that you and your family (including grandparents) or others caring for your newborn are up-to-date with the Tdap or Td immunizations. The Tdap or Td immunization can help protect your newborn from getting ill.  Rubella immunization.  Varicella (chickenpox) immunization.  Influenza immunization. You should receive this annual immunization if you did not receive the immunization during your pregnancy. Document Released: 09/30/2007 Document Revised: 08/27/2012 Document Reviewed: 07/30/2012 [facility] Patient Information 2015 [place], [place]. This  information is not intended to replace advice given to you by your health care provider. Make sure you discuss any questions you have with your health care provider. Postpartum Depression and Baby Blues The postpartum period begins right after the birth of a baby. During this time, there is often a great amount of joy and excitement. It is also a time of many changes in the life of the parents. Regardless of how many times a mother gives birth, each child brings new challenges and dynamics to the family. It is not unusual to have feelings of excitement along with confusing shifts in moods, emotions, and thoughts. All mothers are at risk of developing postpartum depression or the "baby blues." These mood changes can occur right after giving birth, or they may occur many months after giving birth. The baby blues or postpartum depression can be mild or  severe. Additionally, postpartum depression can go away rather quickly, or it can be a long-term condition.  CAUSES Raised hormone levels and the rapid drop in those levels are thought to be a main cause of postpartum depression and the baby blues. A number of hormones change during and after pregnancy. Estrogen and progesterone usually decrease right after the delivery of your baby. The levels of thyroid hormone and various cortisol steroids also rapidly drop. Other factors that play a role in these mood changes include major life events and genetics.  RISK FACTORS If you have any of the following risks for the baby blues or postpartum depression, know what symptoms to watch out for during the postpartum period. Risk factors that may increase the likelihood of getting the baby blues or postpartum depression include:  Having a personal or family history of depression.   Having depression while being pregnant.   Having premenstrual mood issues or mood issues related to oral contraceptives.  Having a lot of life stress.   Having marital conflict.    Lacking a social support network.   Having a baby with special needs.   Having health problems, such as diabetes.  SIGNS AND SYMPTOMS Symptoms of baby blues include:  Brief changes in mood, such as going from extreme happiness to sadness.  Decreased concentration.   Difficulty sleeping.   Crying spells, tearfulness.   Irritability.   Anxiety.  Symptoms of postpartum depression typically begin within the first month after giving birth. These symptoms include:  Difficulty sleeping or excessive sleepiness.   Marked weight loss.   Agitation.   Feelings of worthlessness.   Lack of interest in activity or food.  Postpartum psychosis is a very serious condition and can be dangerous. Fortunately, it is rare. Displaying any of the following symptoms is cause for immediate medical attention. Symptoms of postpartum psychosis include:   Hallucinations and delusions.   Bizarre or disorganized behavior.   Confusion or disorientation.  DIAGNOSIS  A diagnosis is made by an evaluation of your symptoms. There are no medical or lab tests that lead to a diagnosis, but there are various questionnaires that a health care provider may use to identify those with the baby blues, postpartum depression, or psychosis. Often, a screening tool called the [place] Postnatal Depression Scale is used to diagnose depression in the postpartum period.  TREATMENT The baby blues usually goes away on its own in 1-2 weeks. Social support is often all that is needed. You will be encouraged to get adequate sleep and rest. Occasionally, you may be given medicines to help you sleep.  Postpartum depression requires treatment because it can last several months or longer if it is not treated. Treatment may include individual or group therapy, medicine, or both to address any social, physiological, and psychological factors that may play a role in the depression. Regular exercise, a healthy diet,  rest, and social support may also be strongly recommended.  Postpartum psychosis is more serious and needs treatment right away. Hospitalization is often needed. HOME CARE INSTRUCTIONS  Get as much rest as you can. Nap when the baby sleeps.   Exercise regularly. Some women find yoga and walking to be beneficial.   Eat a balanced and nourishing diet.   Do little things that you enjoy. Have a cup of tea, take a bubble bath, read your favorite magazine, or listen to your favorite music.  Avoid alcohol.   Ask for help with household chores, cooking, grocery shopping, or running errands as  needed. Do not try to do everything.   Talk to people close to you about how you are feeling. Get support from your partner, family members, friends, or other new moms.  Try to stay positive in how you think. Think about the things you are grateful for.   Do not spend a lot of time alone.   Only take over-the-counter or prescription medicine as directed by your health care provider.  Keep all your postpartum appointments.   Let your health care provider know if you have any concerns.  SEEK MEDICAL CARE IF: You are having a reaction to or problems with your medicine. SEEK IMMEDIATE MEDICAL CARE IF:  You have suicidal feelings.   You think you may harm the baby or someone else. MAKE SURE YOU:  Understand these instructions.  Will watch your condition.  Will get help right away if you are not doing well or get worse. Document Released: 09/06/2004 Document Revised: 12/08/2013 Document Reviewed: 09/14/2013 [facility] Patient Information 2015 [place], [place]. This information is not intended to replace advice given to you by your health care provider. Make sure you discuss any questions you have with your health care provider.

## 2015-03-25 NOTE — Lactation Note (Signed)
This note was copied from the chart of Gina Chemical engineerVaeda Briere. Lactation Consultation Note  Mother's nipples have abrasions on tips.  Suggest mother call to help w/ next feeding. Discussed how to achieve a deeper latch. Reviewed watching for swallows, monitoring voids/stools and waking techniques. Mother has comfort gels.  Reminded her to change positions w/ each feeding.   Patient Name: Gina [name] BJYNW'GToday's Date: 03/25/2015 Reason for consult: Follow-up assessment   Maternal Data    Feeding Feeding Type: Breast Fed Length of feed: 20 min  LATCH Score/Interventions                      Lactation Tools Discussed/Used     Consult Status Consult Status: Complete    Hardie PulleyBerkelhammer, Adaiah Morken Boschen 03/25/2015, 9:46 AM

## 2015-03-25 NOTE — Discharge Summary (Signed)
Vaginal Delivery Discharge Summary  Gina [name]  DOB:    06/28/1996 MRN:    [id number] CSN:    [id number]  Date of admission:                  03/23/15  Date of discharge:                   03/25/15  Procedures this admission:   SVD, repair of 2nd degree perineal laceration (local anesthesia)  Date of Delivery: 03/23/15  Newborn Data:  Live born female  Birth Weight: 6 lb 11 oz (3033 g) APGAR: 9, 9  Home with mother. Name: [place] Circumcision Plan: NA  History of Present Illness:  [name] is an 19 y.o. female, G1P1001, who presents at [redacted]w[redacted]d weeks gestation. The patient has been followed at [facility]. She was admitted onset of labor. Her pregnancy has been complicated by:  Patient Active Problem List   Diagnosis Date Noted  . Anemia 03/25/2015  . Teen pregnancy 03/23/2015  . H/O: depression--dx 2015 03/23/2015  . Vaginal delivery 03/23/2015     Hospital Course:  The patient was admitted in early labor on 03/23/15. Negative GBS. She proceeded to have a vaginal delivery of a healthy infant with IV Stadol for pain. Her delivery was not complicated, and was attended by Nigel Bridgeman, CNM. Patient and baby tolerated the procedure without difficulty, with 2nd degree perineal laceration (repaired) and left hemostatic periurethral noted. Infant status was good and remained in room with mother.  Mother and infant then had an uncomplicated postpartum course, with breastfeeding going ok. Mom still working with Advertising copywriter for proper latch. She was referred to [facility] for teen pregnancy and h/o depression. Mom's physical exam was WNL, and she was discharged home in stable condition. Contraception plan was Micronor. She received adequate benefit from po pain medications.   Feeding:  breast  Contraception:  oral progesterone-only contraceptive  Hemoglobin Results: Results for orders placed or  performed during the hospital encounter of 03/23/15 (from the past 48 hour(s))  Type and screen     Status: None   Collection Time: 03/23/15  1:45 PM  Result Value Ref Range   ABO/RH(D) O POS    Antibody Screen NEG    Sample Expiration 03/26/2015   CBC     Status: Abnormal   Collection Time: 03/23/15  1:45 PM  Result Value Ref Range   WBC 14.7 (H) 4.0 - 10.5 K/uL   RBC 4.38 3.87 - 5.11 MIL/uL   Hemoglobin 11.4 (L) 12.0 - 15.0 g/dL   HCT 78.2 (L) 95.6 - 21.3 %   MCV 79.7 78.0 - 100.0 fL   MCH 26.0 26.0 - 34.0 pg   MCHC 32.7 30.0 - 36.0 g/dL   RDW 08.6 57.8 - 46.9 %   Platelets 270 150 - 400 K/uL  RPR     Status: None   Collection Time: 03/23/15  1:45 PM  Result Value Ref Range   RPR Ser Ql Non Reactive Non Reactive    Comment: (NOTE) Performed At: [facility] [address] [place], [place] [id number] Mila Homer MD KG:[id number]   HIV antibody     Status: None   Collection Time: 03/23/15  1:45 PM  Result Value Ref Range   HIV Screen 4th Generation wRfx Non Reactive Non Reactive    Comment: (NOTE) Performed At: [facility] [address] [place],  [place] [id number] Mila HomerHancock William F MD WU:[id number]:[phone]   ABO/Rh     Status: None   Collection Time: 03/23/15  1:45 PM  Result Value Ref Range   ABO/RH(D) O POS   CBC     Status: Abnormal   Collection Time: 03/24/15  5:50 AM  Result Value Ref Range   WBC 13.3 (H) 4.0 - 10.5 K/uL   RBC 3.80 (L) 3.87 - 5.11 MIL/uL   Hemoglobin 9.8 (L) 12.0 - 15.0 g/dL   HCT 95.630.0 (L) 21.336.0 - 08.646.0 %   MCV 78.9 78.0 - 100.0 fL   MCH 25.8 (L) 26.0 - 34.0 pg   MCHC 32.7 30.0 - 36.0 g/dL   RDW 57.814.6 46.911.5 - 62.915.5 %   Platelets 247 150 - 400 K/uL    Discharge Physical Exam:   General: alert Lochia: appropriate Uterine Fundus: firm Incision: healing well DVT Evaluation: No evidence of DVT seen on physical exam. Negative Homan's sign.  Intrapartum Procedures: spontaneous vaginal delivery Postpartum Procedures:  none Complications-Operative and Postpartum: 2nd degree perineal laceration, hemostatic left periurethral laceration.  Discharge Diagnoses: Term Pregnancy-delivered  Discharge Information:  Activity:           pelvic rest Diet:                routine Medications: Ibuprofen, Iron and Percocet Condition:      stable     Discharge to: home  Follow-up Information    [facility]. Schedule an appointment as soon as possible for a visit in 6 weeks.   Specialty:  Obstetrics and Gynecology   Why:  Call for any questions or concerns.   Contact information:   [address] [place] [phone]       Sherre ScarletWILLIAMS, Lekendrick Alpern CNM 03/25/2015 9:41 AM

## 2015-06-22 ENCOUNTER — Ambulatory Visit (INDEPENDENT_AMBULATORY_CARE_PROVIDER_SITE_OTHER): Payer: BLUE CROSS/BLUE SHIELD | Admitting: Family Medicine

## 2015-06-22 VITALS — BP 112/76 | HR 84 | Temp 98.4°F | Resp 18 | Ht 64.0 in | Wt 116.8 lb

## 2015-06-22 DIAGNOSIS — K649 Unspecified hemorrhoids: Secondary | ICD-10-CM

## 2015-06-22 DIAGNOSIS — K5900 Constipation, unspecified: Secondary | ICD-10-CM | POA: Diagnosis not present

## 2015-06-22 MED ORDER — LIDOCAINE 5 % EX OINT: 1.0000 "application " | TOPICAL_OINTMENT | Freq: Two times a day (BID) | CUTANEOUS | Status: DC | PRN

## 2015-06-22 MED ORDER — POLYETHYLENE GLYCOL 3350 17 GM/SCOOP PO POWD: 17.0000 g | Freq: Two times a day (BID) | ORAL | Status: DC | PRN

## 2015-06-22 MED ORDER — HYDROCORTISONE 2.5 % RE CREA: 1.0000 "application " | TOPICAL_CREAM | Freq: Two times a day (BID) | RECTAL | Status: DC

## 2015-06-22 NOTE — Patient Instructions (Signed)
Constipation Constipation is when a person has fewer than three bowel movements a week, has difficulty having a bowel movement, or has stools that are dry, hard, or larger than normal. As people grow older, constipation is more common. If you try to fix constipation with medicines that make you have a bowel movement (laxatives), the problem may get worse. Long-term laxative use may cause the muscles of the colon to become weak. A low-fiber diet, not taking in enough fluids, and taking certain medicines may make constipation worse.  CAUSES   Certain medicines, such as antidepressants, pain medicine, iron supplements, antacids, and water pills.   Certain diseases, such as diabetes, irritable bowel syndrome (IBS), thyroid disease, or depression.   Not drinking enough water.   Not eating enough fiber-rich foods.   Stress or travel.   Lack of physical activity or exercise.   Ignoring the urge to have a bowel movement.   Using laxatives too much.  SIGNS AND SYMPTOMS   Having fewer than three bowel movements a week.   Straining to have a bowel movement.   Having stools that are hard, dry, or larger than normal.   Feeling full or bloated.   Pain in the lower abdomen.   Not feeling relief after having a bowel movement.  DIAGNOSIS  Your health care provider will take a medical history and perform a physical exam. Further testing may be done for severe constipation. Some tests may include:  A barium enema X-ray to examine your rectum, colon, and, sometimes, your small intestine.   A sigmoidoscopy to examine your lower colon.   A colonoscopy to examine your entire colon. TREATMENT  Treatment will depend on the severity of your constipation and what is causing it. Some dietary treatments include drinking more fluids and eating more fiber-rich foods. Lifestyle treatments may include regular exercise. If these diet and lifestyle recommendations do not help, your health care  provider may recommend taking over-the-counter laxative medicines to help you have bowel movements. Prescription medicines may be prescribed if over-the-counter medicines do not work.  HOME CARE INSTRUCTIONS   Eat foods that have a lot of fiber, such as fruits, vegetables, whole grains, and beans.  Limit foods high in fat and processed sugars, such as french fries, hamburgers, cookies, candies, and soda.   A fiber supplement may be added to your diet if you cannot get enough fiber from foods.   Drink enough fluids to keep your urine clear or pale yellow.   Exercise regularly or as directed by your health care provider.   Go to the restroom when you have the urge to go. Do not hold it.   Only take over-the-counter or prescription medicines as directed by your health care provider. Do not take other medicines for constipation without talking to your health care provider first.  SEEK IMMEDIATE MEDICAL CARE IF:   You have bright red blood in your stool.   Your constipation lasts for more than 4 days or gets worse.   You have abdominal or rectal pain.   You have thin, pencil-like stools.   You have unexplained weight loss. MAKE SURE YOU:   Understand these instructions.  Will watch your condition.  Will get help right away if you are not doing well or get worse. Document Released: 08/31/2004 Document Revised: 12/08/2013 Document Reviewed: 09/14/2013 [employer] Patient Information 2015 [employer]. This information is not intended to replace advice given to you by your health care provider. Make sure you discuss any questions   you have with your health care provider. Hemorrhoids Hemorrhoids are swollen veins around the rectum or anus. There are two types of hemorrhoids:   Internal hemorrhoids. These occur in the veins just inside the rectum. They may poke through to the outside and become irritated and painful.  External hemorrhoids. These occur in the veins outside the  anus and can be felt as a painful swelling or hard lump near the anus. CAUSES  Pregnancy.   Obesity.   Constipation or diarrhea.   Straining to have a bowel movement.   Sitting for long periods on the toilet.  Heavy lifting or other activity that caused you to strain.  Anal intercourse. SYMPTOMS   Pain.   Anal itching or irritation.   Rectal bleeding.   Fecal leakage.   Anal swelling.   One or more lumps around the anus.  DIAGNOSIS  Your caregiver may be able to diagnose hemorrhoids by visual examination. Other examinations or tests that may be performed include:   Examination of the rectal area with a gloved hand (digital rectal exam).   Examination of anal canal using a small tube (scope).   A blood test if you have lost a significant amount of blood.  A test to look inside the colon (sigmoidoscopy or colonoscopy). TREATMENT Most hemorrhoids can be treated at home. However, if symptoms do not seem to be getting better or if you have a lot of rectal bleeding, your caregiver may perform a procedure to help make the hemorrhoids get smaller or remove them completely. Possible treatments include:   Placing a rubber band at the base of the hemorrhoid to cut off the circulation (rubber band ligation).   Injecting a chemical to shrink the hemorrhoid (sclerotherapy).   Using a tool to burn the hemorrhoid (infrared light therapy).   Surgically removing the hemorrhoid (hemorrhoidectomy).   Stapling the hemorrhoid to block blood flow to the tissue (hemorrhoid stapling).  HOME CARE INSTRUCTIONS   Eat foods with fiber, such as whole grains, beans, nuts, fruits, and vegetables. Ask your doctor about taking products with added fiber in them (fibersupplements).  Increase fluid intake. Drink enough water and fluids to keep your urine clear or pale yellow.   Exercise regularly.   Go to the bathroom when you have the urge to have a bowel movement. Do not  wait.   Avoid straining to have bowel movements.   Keep the anal area dry and clean. Use wet toilet paper or moist towelettes after a bowel movement.   Medicated creams and suppositories may be used or applied as directed.   Only take over-the-counter or prescription medicines as directed by your caregiver.   Take warm sitz baths for 15-20 minutes, 3-4 times a day to ease pain and discomfort.   Place ice packs on the hemorrhoids if they are tender and swollen. Using ice packs between sitz baths may be helpful.   Put ice in a plastic bag.   Place a towel between your skin and the bag.   Leave the ice on for 15-20 minutes, 3-4 times a day.   Do not use a donut-shaped pillow or sit on the toilet for long periods. This increases blood pooling and pain.  SEEK MEDICAL CARE IF:  You have increasing pain and swelling that is not controlled by treatment or medicine.  You have uncontrolled bleeding.  You have difficulty or you are unable to have a bowel movement.  You have pain or inflammation outside the area of the   hemorrhoids. MAKE SURE YOU:  Understand these instructions.  Will watch your condition.  Will get help right away if you are not doing well or get worse. Document Released: 11/30/2000 Document Revised: 11/19/2012 Document Reviewed: 10/07/2012 [employer] Patient Information 2015 [employer]. This information is not intended to replace advice given to you by your health care provider. Make sure you discuss any questions you have with your health care provider.  

## 2015-06-28 NOTE — Progress Notes (Signed)
Chief Complaint:  Chief Complaint  Patient presents with  . Constipation    x 2 months, have tried stool softners, blood in stool     HPI: Gina [name] is a 19 y.o. female who reports to [facility] today complaining of: 1. Acute on chronic constipation for the last 2 months. She has tried stool softeners without relief. She has never tried MiraLAX. She had this problem also during pregnancy. She had some bright red blood in her stools when she wipes. It was not in the toilet bowl. She had to strain. Her stool was hard. The colors were normal. Then she may have an hemorrhoid. Nice fevers, chills nausea vomiting abdominal pain, chest pain shortness of breath palpitations or dizziness. There is no family history of Crohn's disease, irritable bowel syndrome, inflammatory bowel disease, UC. No family history of colon cancer.  Past Medical History  Diagnosis Date  . Headache   . Restless leg syndrome   . Depression   . Pyelonephritis   . Need for varicella vaccine    History reviewed. No pertinent past surgical history. History   Social History  . Marital Status: Single    Spouse Name: N/A  . Number of Children: N/A  . Years of Education: N/A   Social History Main Topics  . Smoking status: Never Smoker   . Smokeless tobacco: Never Used  . Alcohol Use: No  . Drug Use: No  . Sexual Activity: Not Currently   Other Topics Concern  . None   Social History Narrative   Family History  Problem Relation Age of Onset  . Anxiety disorder Mother   . Migraines Neg Hx    No Known Allergies Prior to Admission medications   Medication Sig Start Date End Date Taking? Authorizing Provider  hydrocortisone (ANUSOL-HC) 2.5 % rectal cream Place 1 application rectally 2 (two) times daily. 06/22/15   Cass Vandermeulen P Cela Newcom, DO  lidocaine (XYLOCAINE) 5 % ointment Apply 1 application topically 2 (two) times daily as needed. 06/22/15   Cherylene Ferrufino P Johnni Wunschel, DO  polyethylene glycol powder (GLYCOLAX/MIRALAX) powder Take  17 g by mouth 2 (two) times daily as needed. 06/22/15   Blakelee Allington P Sammy Cassar, DO     ROS: The patient denies fevers, chills, night sweats, unintentional weight loss, chest pain, palpitations, wheezing, dyspnea on exertion, nausea, vomiting, abdominal pain, dysuria, hematuria, melena, numbness, weakness, or tingling.   All other systems have been reviewed and were otherwise negative with the exception of those mentioned in the HPI and as above.    PHYSICAL EXAM: Filed Vitals:   06/22/15 1327  BP: 112/76  Pulse: 84  Temp: 98.4 F (36.9 C)  Resp: 18   Body mass index is 20.04 kg/(m^2).   General: Alert, no acute distress HEENT:  Normocephalic, atraumatic, oropharynx patent. EOMI, PERRLA Cardiovascular:  Regular rate and rhythm, no rubs murmurs or gallops.  No Carotid bruits, radial pulse intact. No pedal edema.  Respiratory: Clear to auscultation bilaterally.  No wheezes, rales, or rhonchi.  No cyanosis, no use of accessory musculature Abdominal: No organomegaly, abdomen is soft and non-tender, positive bowel sounds. No masses. Skin: No rashes. Neurologic: Facial musculature symmetric. Psychiatric: Patient acts appropriately throughout our interaction. Lymphatic: No cervical or submandibular lymphadenopathy Musculoskeletal: Gait intact. No edema, tenderness Rectal exam was pretty unremarkable. She does have a slight rectal abrasion and also small hemorrhoid/skin contact at the 6:00 position. I did not palpate that there was any echo masses or lesions or  internal hemorrhoids.   LABS: Results for orders placed or performed during the hospital encounter of 03/23/15  OB RESULT CONSOLE Group B Strep  Result Value Ref Range   GBS Negative   OB RESULT CONSOLE Group B Strep  Result Value Ref Range   GBS Negative   CBC  Result Value Ref Range   WBC 14.7 (H) 4.0 - 10.5 K/uL   RBC 4.38 3.87 - 5.11 MIL/uL   Hemoglobin 11.4 (L) 12.0 - 15.0 g/dL   HCT 16.134.9 (L) 09.636.0 - 04.546.0 %   MCV 79.7 78.0 - 100.0  fL   MCH 26.0 26.0 - 34.0 pg   MCHC 32.7 30.0 - 36.0 g/dL   RDW 40.914.5 81.111.5 - 91.415.5 %   Platelets 270 150 - 400 K/uL  RPR  Result Value Ref Range   RPR Ser Ql Non Reactive Non Reactive  HIV antibody  Result Value Ref Range   HIV Screen 4th Generation wRfx Non Reactive Non Reactive  OB RESULTS CONSOLE GC/Chlamydia  Result Value Ref Range   Gonorrhea Negative    Chlamydia Negative   OB RESULTS CONSOLE RPR  Result Value Ref Range   RPR Nonreactive   OB RESULTS CONSOLE HIV antibody  Result Value Ref Range   HIV Non-reactive   OB RESULTS CONSOLE Rubella Antibody  Result Value Ref Range   Rubella Immune   OB RESULTS CONSOLE Hepatitis B surface antigen  Result Value Ref Range   Hepatitis B Surface Ag Negative   CBC  Result Value Ref Range   WBC 13.3 (H) 4.0 - 10.5 K/uL   RBC 3.80 (L) 3.87 - 5.11 MIL/uL   Hemoglobin 9.8 (L) 12.0 - 15.0 g/dL   HCT 78.230.0 (L) 95.636.0 - 21.346.0 %   MCV 78.9 78.0 - 100.0 fL   MCH 25.8 (L) 26.0 - 34.0 pg   MCHC 32.7 30.0 - 36.0 g/dL   RDW 08.614.6 57.811.5 - 46.915.5 %   Platelets 247 150 - 400 K/uL  Type and screen  Result Value Ref Range   ABO/RH(D) O POS    Antibody Screen NEG    Sample Expiration 03/26/2015   OB RESULTS CONSOLE ABO/Rh  Result Value Ref Range   RH Type  Positive    ABO Grouping O   OB RESULTS CONSOLE Antibody Screen  Result Value Ref Range   Antibody Screen Negative   ABO/Rh  Result Value Ref Range   ABO/RH(D) O POS      EKG/XRAY:   [facility]   ASSESSMENT/PLAN: Encounter Diagnoses  Name Primary?  . Bleeding hemorrhoid Yes  . Constipation, unspecified constipation type    Push fluids, try MiraLAX and also over-the-counter stool softener. She may try witch hazel and also mineral oil if she wants to. She just needs to be consistent with the laxity of and I would recommend MiraLAX since since it is more gentle and requires her to take in fluids Anusol HC when necessary Follow-up as needed, if this  continues and she will need to be referred to gastroenterology for follow-up.  Gross sideeffects, risk and benefits, and alternatives of medications d/w patient. Patient is aware that all medications have potential sideeffects and we are unable to predict every sideeffect or drug-drug interaction that may occur.  Meka Lewan DO  06/28/2015 3:24 PM

## 2015-12-18 HISTORY — PX: DENTAL SURGERY: SHX609

## 2017-05-21 DIAGNOSIS — R55 Syncope and collapse: Secondary | ICD-10-CM | POA: Insufficient documentation

## 2017-05-21 DIAGNOSIS — N3000 Acute cystitis without hematuria: Secondary | ICD-10-CM | POA: Insufficient documentation

## 2017-07-06 DIAGNOSIS — Z3490 Encounter for supervision of normal pregnancy, unspecified, unspecified trimester: Secondary | ICD-10-CM | POA: Insufficient documentation

## 2019-11-30 ENCOUNTER — Other Ambulatory Visit: Payer: Self-pay | Admitting: Otolaryngology

## 2019-11-30 DIAGNOSIS — R590 Localized enlarged lymph nodes: Secondary | ICD-10-CM

## 2019-12-14 ENCOUNTER — Ambulatory Visit
Admission: RE | Admit: 2019-12-14 | Discharge: 2019-12-14 | Disposition: A | Discharge status: Home / Self Care | Payer: Medicaid Other | Source: Ambulatory Visit | Attending: Otolaryngology | Admitting: Otolaryngology

## 2019-12-14 DIAGNOSIS — R590 Localized enlarged lymph nodes: Secondary | ICD-10-CM

## 2019-12-18 HISTORY — PX: NOSE SURGERY: SHX723

## 2020-07-14 IMAGING — US US SOFT TISSUE HEAD/NECK
1 series · 14 of 14 positions shown · non-contrast
Comparison: None.

CLINICAL DATA: 23-year-old female with a history of possible
adenopathy

EXAM:
ULTRASOUND OF HEAD/NECK SOFT TISSUES
TECHNIQUE: Ultrasound examination of the head and neck soft tissues was
performed in the area of clinical concern.

[Series 1: us soft tissue head/neck · 0.07mm/px · 14 acquisitions, 14 frames shown]
[im 1/14]
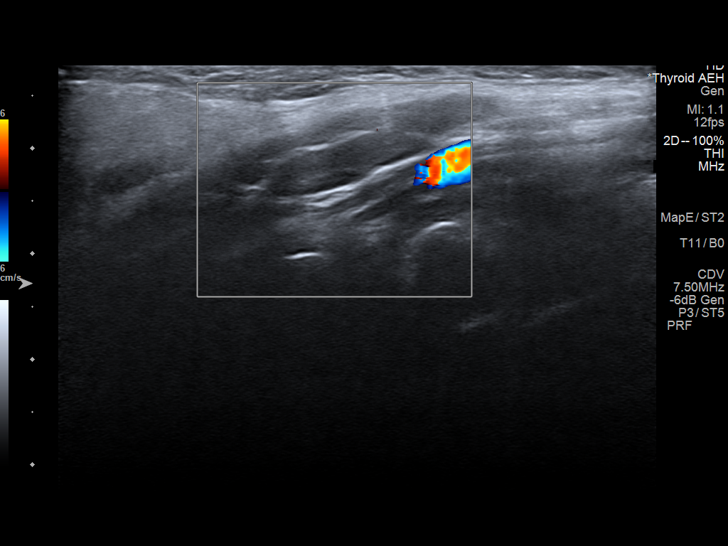
[im 2/14]
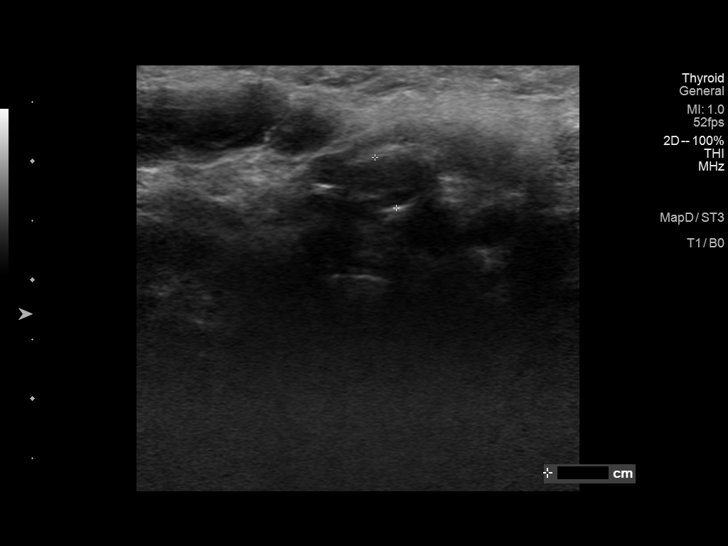
[im 3/14]
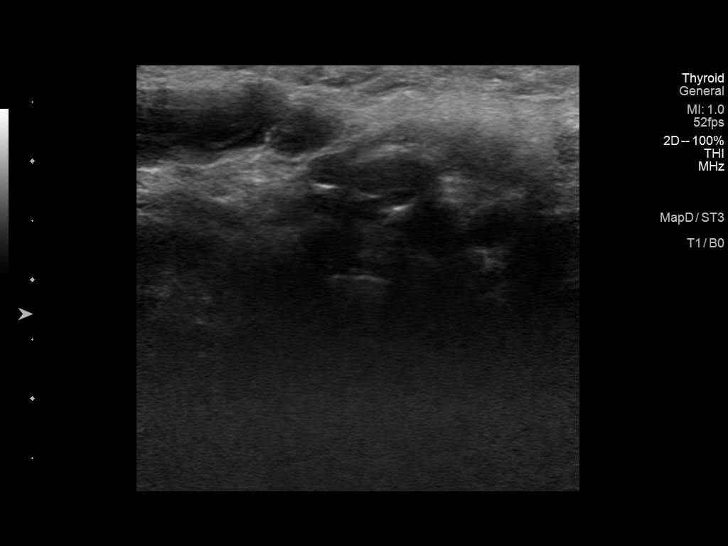
[im 4/14]
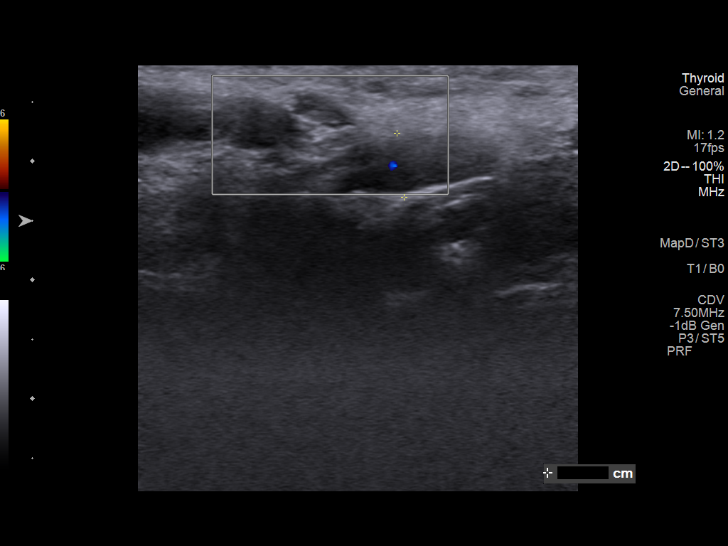
[im 5/14]
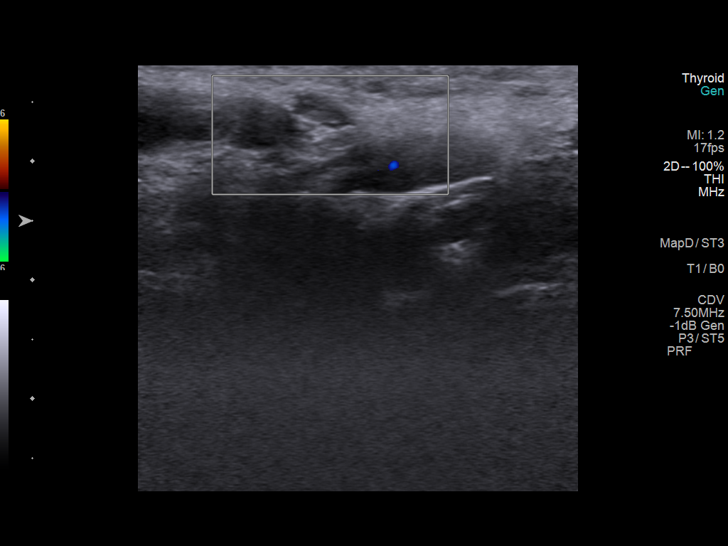
[im 6/14]
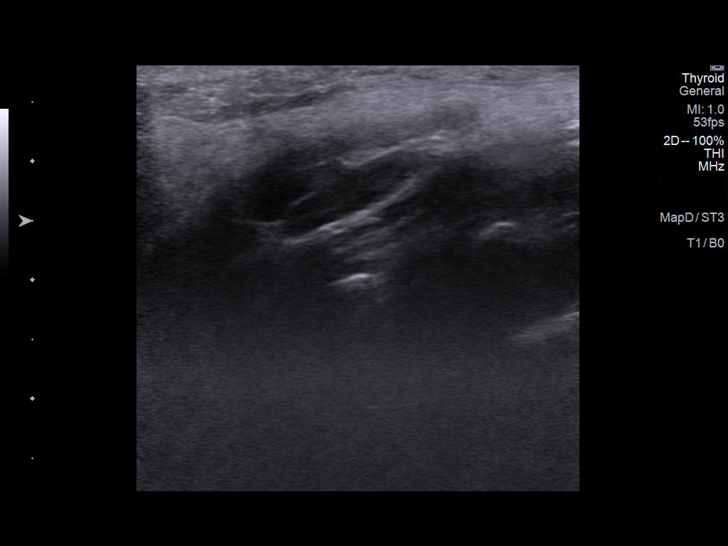
[im 7/14]
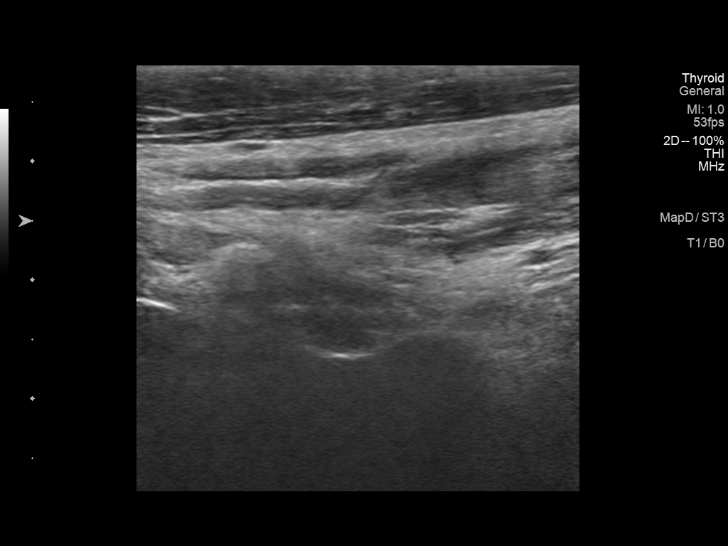
[im 8/14]
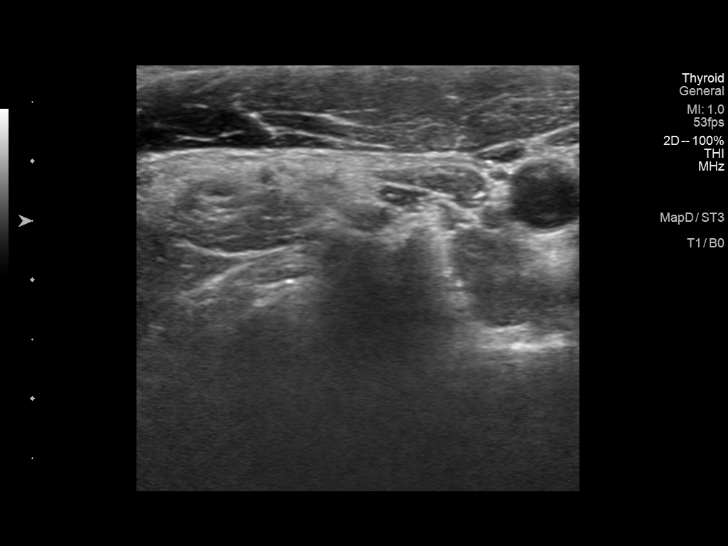
[im 9/14]
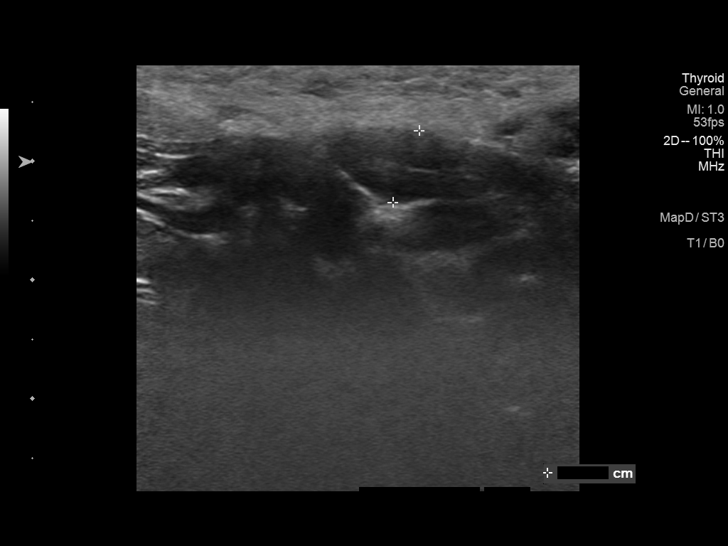
[im 10/14]
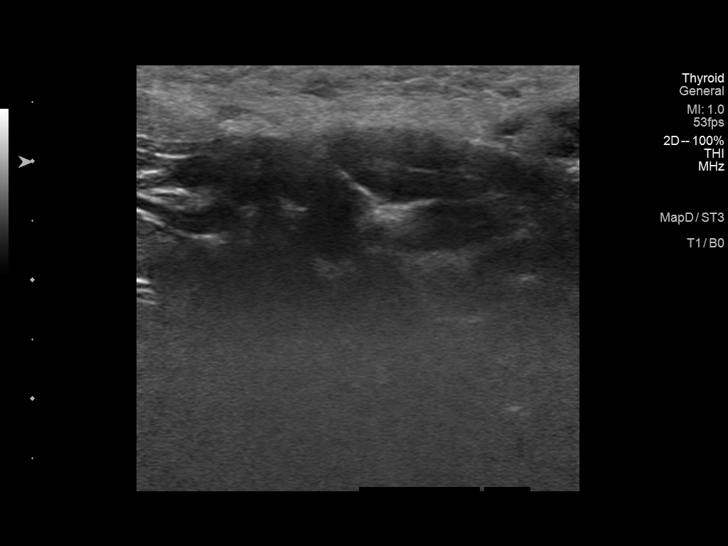
[im 11/14]
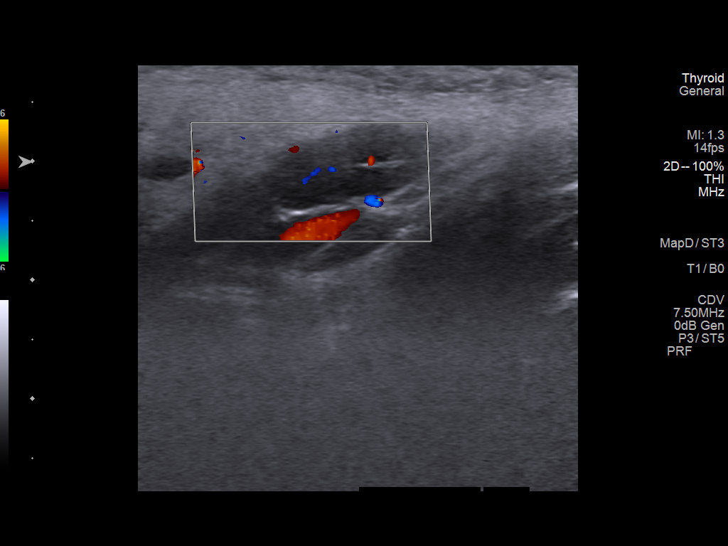
[im 12/14]
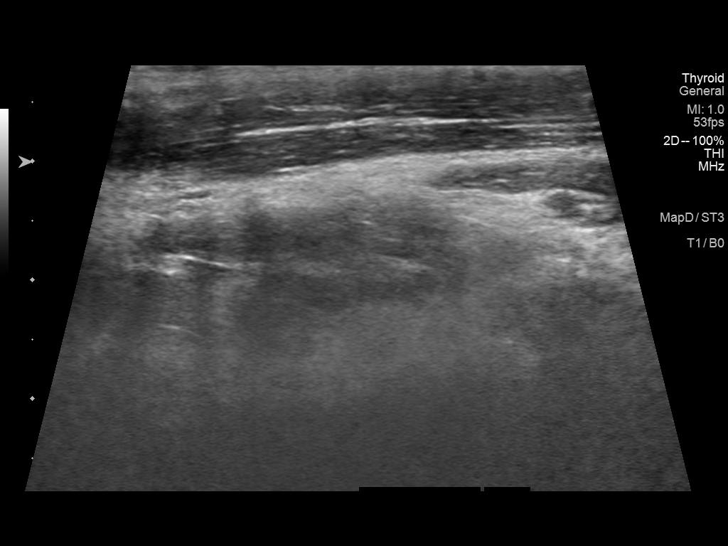
[im 13/14]
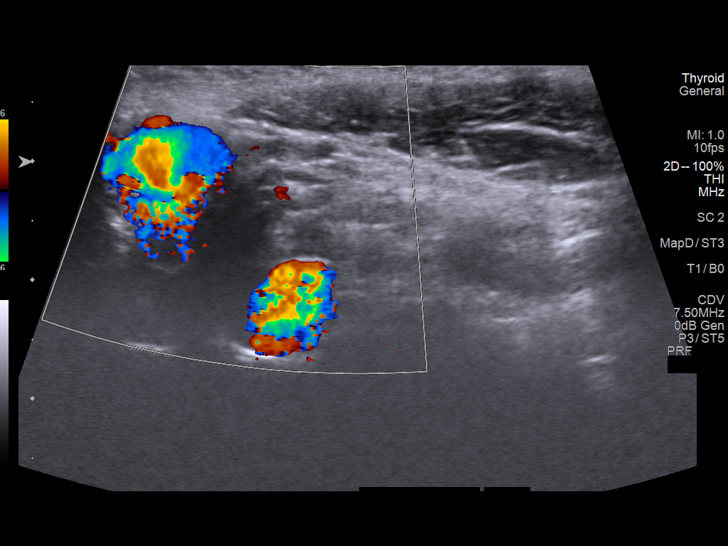
[im 14/14]
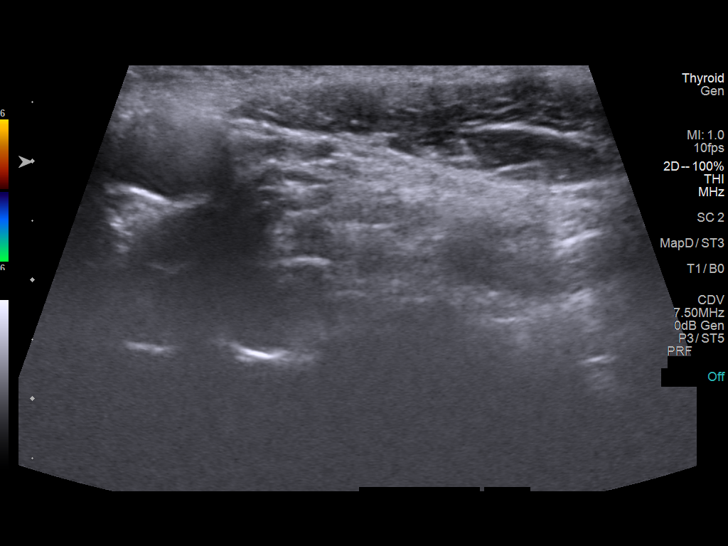

[14 of 14 positions shown; findings below may reference images not displayed]

FINDINGS: Grayscale and color duplex performed in the region of clinical
concern.

Lymph nodes are imaged in the region of clinical concern, not
enlarged and with typical architecture.
IMPRESSION: Sonographic survey demonstrates typical appearing cervical lymph
nodes, potentially reactive.

## 2020-08-09 ENCOUNTER — Ambulatory Visit (HOSPITAL_COMMUNITY)
Admission: EM | Admit: 2020-08-09 | Discharge: 2020-08-09 | Disposition: A | Discharge status: Home / Self Care | Payer: Medicaid Other | Attending: Family Medicine | Admitting: Family Medicine

## 2020-08-09 ENCOUNTER — Other Ambulatory Visit: Payer: Self-pay

## 2020-08-09 ENCOUNTER — Encounter (HOSPITAL_COMMUNITY): Payer: Self-pay

## 2020-08-09 DIAGNOSIS — J029 Acute pharyngitis, unspecified: Secondary | ICD-10-CM | POA: Diagnosis not present

## 2020-08-09 DIAGNOSIS — J020 Streptococcal pharyngitis: Secondary | ICD-10-CM | POA: Diagnosis not present

## 2020-08-09 LAB — POCT RAPID STREP A, ED / UC: Streptococcus, Group A Screen (Direct): POSITIVE — AB

## 2020-08-09 MED ORDER — AMOXICILLIN-POT CLAVULANATE 875-125 MG PO TABS: 1.0000 | ORAL_TABLET | Freq: Two times a day (BID) | ORAL | 0 refills | Status: DC

## 2020-08-09 MED ORDER — DEXAMETHASONE SODIUM PHOSPHATE 10 MG/ML IJ SOLN
10.0000 mg | Freq: Once | INTRAMUSCULAR | Status: AC
  Administered 2020-08-09: 10 mg via INTRAMUSCULAR

## 2020-08-09 MED ORDER — DEXAMETHASONE 10 MG/ML FOR PEDIATRIC ORAL USE
INTRAMUSCULAR | Status: AC
  Filled 2020-08-09: qty 1

## 2020-08-09 NOTE — ED Triage Notes (Signed)
Pt c/o sore throat, dysphagia and chillsx3 days.

## 2020-08-09 NOTE — Discharge Instructions (Addendum)
Treating you for strep throat.  Take the antibiotics as prescribed. Steroid injection given here in clinic for tonsillar swelling and inflammation.  You can continue ibuprofen and Tylenol at home for fever and pain Warm salt water gargles Follow up as needed for continued or worsening symptoms

## 2020-08-10 NOTE — ED Provider Notes (Signed)
[facility]    CSN: [id number] Arrival date & time: 08/09/20  6295      History   Chief Complaint Chief Complaint  Patient presents with  . Sore Throat    HPI Gina [name] is a 24 y.o. female.   Pt is a 24 year old female that presents with sore throat.  This is been present and worsening for the past 3 days.  Pain with swallowing, chills.  Low-grade fever.  Has been taking medication over-the-counter without much relief.  Recently recovered from Covid.     Past Medical History:  Diagnosis Date  . Depression   . Headache   . Need for varicella vaccine   . Pyelonephritis   . Restless leg syndrome     Patient Active Problem List   Diagnosis Date Noted  . Anemia 03/25/2015  . Teen pregnancy 03/23/2015  . H/O: depression--dx 2015 03/23/2015  . Vaginal delivery 03/23/2015    Past Surgical History:  Procedure Laterality Date  . NOSE SURGERY      OB History    Gravida  1   Para  1   Term  1   Preterm      AB      Living  1     SAB      TAB      Ectopic      Multiple  0   Live Births  1            Home Medications    Prior to Admission medications   Medication Sig Start Date End Date Taking? Authorizing Provider  amoxicillin-clavulanate (AUGMENTIN) 875-125 MG tablet Take 1 tablet by mouth every 12 (twelve) hours. 08/09/20   Janace Aris, NP    Family History Family History  Problem Relation Age of Onset  . Anxiety disorder Mother   . Migraines Neg Hx     Social History Social History   Tobacco Use  . Smoking status: Never Smoker  . Smokeless tobacco: Never Used  Substance Use Topics  . Alcohol use: No    Alcohol/week: 0.0 standard drinks  . Drug use: No     Allergies   Patient has no known allergies.   Review of Systems Review of Systems   Physical Exam Triage Vital Signs ED Triage Vitals  Enc Vitals Group     BP 08/09/20 0921 108/68     Pulse Rate 08/09/20 0921 90     Resp 08/09/20 0921 16      Temp 08/09/20 0921 99.1 F (37.3 C)     Temp Source 08/09/20 0921 Oral     SpO2 08/09/20 0921 100 %     Weight 08/09/20 0923 120 lb (54.4 kg)     Height 08/09/20 0923 5\' 5"  (1.651 m)     Head Circumference --      Peak Flow --      Pain Score 08/09/20 0923 6     Pain Loc --      Pain Edu? --      Excl. in GC? --    No data found.  Updated Vital Signs BP 108/68   Pulse 90   Temp 99.1 F (37.3 C) (Oral)   Resp 16   Ht 5\' 5"  (1.651 m)   Wt 120 lb (54.4 kg)   SpO2 100%   BMI 19.97 kg/m   Visual Acuity Right Eye Distance:   Left Eye Distance:   Bilateral Distance:    Right  Eye Near:   Left Eye Near:    Bilateral Near:     Physical Exam Vitals and nursing note reviewed.  Constitutional:      General: She is not in acute distress.    Appearance: Normal appearance. She is not ill-appearing, toxic-appearing or diaphoretic.  HENT:     Head: Normocephalic.     Nose: Nose normal.     Mouth/Throat:     Pharynx: Oropharynx is clear. Uvula midline. Posterior oropharyngeal erythema present.     Tonsils: 1+ on the right. 1+ on the left.  Eyes:     Conjunctiva/sclera: Conjunctivae normal.  Pulmonary:     Effort: Pulmonary effort is normal.  Musculoskeletal:        General: Normal range of motion.     Cervical back: Normal range of motion.  Skin:    General: Skin is warm and dry.     Findings: No rash.  Neurological:     Mental Status: She is alert.  Psychiatric:        Mood and Affect: Mood normal.      UC Treatments / Results  Labs (all labs ordered are listed, but only abnormal results are displayed) Labs Reviewed  POCT RAPID STREP A, ED / UC - Abnormal; Notable for the following components:      Result Value   Streptococcus, Group A Screen (Direct) POSITIVE (*)    All other components within normal limits    EKG   Radiology No results found.  Procedures Procedures (including critical care time)  Medications Ordered in UC Medications    dexamethasone (DECADRON) injection 10 mg (10 mg Intramuscular Given 08/09/20 1007)    Initial Impression / Assessment and Plan / UC Course  I have reviewed the triage vital signs and the nursing notes.  Pertinent labs & imaging results that were available during my care of the patient were reviewed by me and considered in my medical decision making (see chart for details).     Strep pharyngitis Treated with amoxicillin.  Dexamethasone injection given here for tonsillar swelling and inflammation. Recommended ibuprofen as needed, warm salt water gargles. Follow up as needed for continued or worsening symptoms  Final Clinical Impressions(s) / UC Diagnoses   Final diagnoses:  Strep pharyngitis     Discharge Instructions     Treating you for strep throat.  Take the antibiotics as prescribed. Steroid injection given here in clinic for tonsillar swelling and inflammation.  You can continue ibuprofen and Tylenol at home for fever and pain Warm salt water gargles Follow up as needed for continued or worsening symptoms     ED Prescriptions    Medication Sig Dispense Auth. Provider   amoxicillin-clavulanate (AUGMENTIN) 875-125 MG tablet Take 1 tablet by mouth every 12 (twelve) hours. 14 tablet Samaia Iwata A, NP     PDMP not reviewed this encounter.   Janace Aris, NP 08/10/20 1216

## 2021-04-18 ENCOUNTER — Inpatient Hospital Stay (HOSPITAL_COMMUNITY)
Admission: AD | Admit: 2021-04-18 | Discharge: 2021-04-18 | Disposition: A | Discharge status: Home / Self Care | Payer: BC Managed Care – PPO | Attending: Obstetrics and Gynecology | Admitting: Obstetrics and Gynecology

## 2021-04-18 ENCOUNTER — Inpatient Hospital Stay (HOSPITAL_COMMUNITY): Payer: BC Managed Care – PPO

## 2021-04-18 ENCOUNTER — Other Ambulatory Visit: Payer: Self-pay

## 2021-04-18 ENCOUNTER — Encounter (HOSPITAL_COMMUNITY): Payer: Self-pay | Admitting: Obstetrics and Gynecology

## 2021-04-18 DIAGNOSIS — O039 Complete or unspecified spontaneous abortion without complication: Secondary | ICD-10-CM

## 2021-04-18 DIAGNOSIS — R109 Unspecified abdominal pain: Secondary | ICD-10-CM | POA: Diagnosis present

## 2021-04-18 DIAGNOSIS — O26891 Other specified pregnancy related conditions, first trimester: Secondary | ICD-10-CM | POA: Diagnosis not present

## 2021-04-18 DIAGNOSIS — Z3A10 10 weeks gestation of pregnancy: Secondary | ICD-10-CM | POA: Diagnosis not present

## 2021-04-18 DIAGNOSIS — O200 Threatened abortion: Secondary | ICD-10-CM

## 2021-04-18 LAB — HCG, QUANTITATIVE, PREGNANCY: hCG, Beta Chain, Quant, S: 6944 m[IU]/mL — ABNORMAL HIGH (ref <5)

## 2021-04-18 LAB — URINALYSIS, ROUTINE W REFLEX MICROSCOPIC
Bilirubin Urine: NEGATIVE
Glucose, UA: NEGATIVE mg/dL
Hgb urine dipstick: NEGATIVE
Ketones, ur: NEGATIVE mg/dL
Leukocytes,Ua: NEGATIVE
Nitrite: NEGATIVE
Protein, ur: NEGATIVE mg/dL
Specific Gravity, Urine: 1.013 (ref 1.005–1.030)
pH: 5 (ref 5.0–8.0)

## 2021-04-18 MED ORDER — MISOPROSTOL 200 MCG PO TABS: 800.0000 ug | ORAL_TABLET | Freq: Once | ORAL | 0 refills | Status: DC

## 2021-04-18 MED ORDER — OXYCODONE-ACETAMINOPHEN 5-325 MG PO TABS: 1.0000 | ORAL_TABLET | Freq: Four times a day (QID) | ORAL | 0 refills | Status: AC | PRN

## 2021-04-18 MED ORDER — PROMETHAZINE HCL 12.5 MG PO TABS: 12.5000 mg | ORAL_TABLET | Freq: Three times a day (TID) | ORAL | 0 refills | Status: DC | PRN

## 2021-04-18 NOTE — Discharge Instructions (Signed)
FACTS YOU SHOULD KNOW  WHAT IS AN EARLY PREGNANCY FAILURE? Once the egg is fertilized with the sperm and begins to develop, it attaches to the lining of the uterus. This early pregnancy tissue may not develop into an embryo (the beginning stage of a baby). Sometimes an embryo does develop but does not continue to grow. These problems can be seen on ultrasound.   MANAGEMNT OF EARLY PREGNANCY FAILURE: About 4 out of 100 (0.25%) women will have a pregnancy loss in her lifetime.  One in five pregnancies is found to be an early pregnancy failure.  There are 3 ways to care for an early pregnancy failure:   (1) Surgery, (2) Medicine, (3) Waiting for you to pass the pregnancy on your own. The decision as to how to proceed after being diagnosed with and early pregnancy failure is an individual one.  The decision can be made only after appropriate counseling.  You need to weigh the pros and cons of the 3 choices. Then you can make the choice that works for you. SURGERY (D&E) . Procedure over in 1 day . Requires being put to sleep . Bleeding may be light . Possible problems during surgery, including injury to womb(uterus) . Care provider has more control Medicine (CYTOTEC) . The complete procedure may take days to weeks . No Surgery . Bleeding may be heavy at times . There may be drug side effects . Patient has more control Waiting . You may choose to wait, in which case your own body may complete the passing of the abnormal early pregnancy on its own in about 2-4 weeks . Your bleeding may be heavy at times . There is a small possibility that you may need surgery if the bleeding is too much or not all of the pregnancy has passed. CYTOTEC MANAGEMENT Prostaglandins (cytotec) are the most widely used drug for this purpose. They cause the uterus to cramp and contract. You will place the medicine yourself inside your vagina in the privacy of your home. Empting of the uterus should occur within 3 days but  the process may continue for several weeks. The bleeding may seem heavy at times. POSSIBLE SIDE EFFECTS FROM CYTOTEC . Nausea   Vomiting . Diarrhea Fever . Chills  Hot Flashes Side effects  from the process of the early pregnancy failure include: . Cramping  Bleeding . Headaches  Dizziness RISKS: This is a low risk procedure. Less than 1 in 100 women has a complication. An incomplete passage of the early pregnancy may occur. Also, Hemorrhage (heavy bleeding) could happen.  Rarely the pregnancy will not be passed completely. Excessively heavy bleeding may occur.  Your doctor may need to perform surgery to empty the uterus (D&E). Afterwards: Everybody will feel differently after the early pregnancy completion. You may have soreness or cramps for a day or two. You may have soreness or cramps for day or two.  You may have light bleeding for up to 2 weeks. You may be as active as you feel like being. If you have any of the following problems you may call [facility]missions [facility] at [phone]. . If you have pain that does not get better  with pain medication . Bleeding that soaks through 2 thick full-sized sanitary pads in an hour . Cramps that last longer than 2 days . Foul smelling discharge . Fever above 100.4 degrees F Even if you do not have any of these symptoms, you should have a follow-up exam to make sure you   are healing properly. This appointment will be made for you before you leave the hospital. Your next normal period will start again in 4-6 week after the loss. You can get pregnant soon after the loss, so use birth control right away. Finally: Make sure all your questions are answered before during and after any procedure. Follow up with medical care and family planning methods.       Miscarriage A miscarriage is the loss of a pregnancy before the 20th week of pregnancy. Sometimes, a pregnancy ends before a woman knows that she is pregnant. If you lose a pregnancy, talk with  your doctor about:  Questions you have about the loss of your baby.  How to work through your grief.  Plans for future pregnancy. What are the causes? Many times, the cause of this condition is not known. What increases the risk? These things may make a pregnant woman more likely to lose a pregnancy: Certain health conditions  Conditions that affect hormones, such as: ? Thyroid disease. ? Polycystic ovary syndrome.  Diabetes.  A disease that causes the body's disease-fighting system to attack itself by mistake.  Infections.  Bleeding problems.  Being very overweight. Lifestyle factors  Using products that have tobacco or nicotine in them.  Being around tobacco smoke.  Having alcohol.  Having a lot of caffeine.  Using drugs. Problems with reproductive organs or parts  Having a cervix that opens and thins before your due date. The cervix is the lowest part of your womb.  Having Asherman syndrome, which leads to: ? Scars in the womb. ? The womb being abnormal in shape.  Growths (fibroids) in the womb.  Problems in the body that are present at birth.  Infection of the cervix or womb. Personal or health history  Injury.  Having lost a pregnancy before.  Being younger than age 78 or older than age 71.  Being around a harmful substance, such as radiation.  Having lead or other heavy metals in: ? Things you eat or drink. ? The air around you.  Using certain medicines. What are the signs or symptoms?  Blood or spots of blood coming from the vagina. You may also have cramps or pain.  Pain or cramps in the belly or low back.  Fluid or tissue coming out of the vagina. How is this treated? Sometimes, treatment is not needed. If you need treatment, you may be treated with:  A procedure to open the cervix more and take tissue out of the womb.  Medicines. You may get a shot of medicine called Rho(D) immune globulin. Follow these instructions at  home: Medicines  Take over-the-counter and prescription medicines only as told by your doctor.  If you were prescribed antibiotic medicine, take it as told by your doctor. Do not stop taking it even if you start to feel better. Activity  Rest as told by your doctor. Ask your doctor what activities are safe for you.  Have someone help you at home during this time. General instructions  Watch how much tissue comes out of the vagina.  Watch the size of any blood clots that come out of the vagina.  Do not have sex or douche until your doctor says it is okay.  Do not put things, such as tampons, in your vagina until your doctor says it is okay.  To help you and your partner with grieving: ? Talk with your doctor. ? See a Veterinary surgeon.  When you are ready, talk with  your doctor about: ? Things to do for your health. ? How you can be healthy if you get pregnant again.  Keep all follow-up visits.   Where to find more information  The [employer] of [employer]stetricians and Gynecologists: acog.org  [place] Department of Health and Cytogeneticist of Women's Health: http://hoffman.com/ Contact a doctor if:  You have a fever or chills.  There is bad-smelling fluid coming from the vagina.  You have more bleeding.  Tissue or clots of blood come out of your vagina. Get help right away if:  You have very bad cramps or pain in your back or belly.  You soak more than 2 large pads in an hour for more than 2 hours.  You get light-headed or weak.  You faint.  You feel sad, and you have sad thoughts a lot of the time.  You think about hurting yourself. Get help right awayif you feel like you may hurt yourself or others, or have thoughts about taking your own life. Go to your nearest emergency room or:  Call your local emergency services (911 in the [place]  Call the National Suicide Prevention Lifeline at 386-708-9187. This is open 24 hours a day.  Text the Crisis  Text Line at (401)409-5823. Summary  A miscarriage is the loss of a pregnancy before the 20th week of pregnancy. Sometimes, a pregnancy ends before a woman knows that she is pregnant.  Follow instructions from your doctor about medicines and activity.  To help you and your partner with grieving, talk with your doctor or a counselor.  Keep all follow-up visits. This information is not intended to replace advice given to you by your health care provider. Make sure you discuss any questions you have with your health care provider. Document Revised: 06/03/2020 Document Reviewed: 06/03/2020 Elsevier Patient Education  2021 [employer].

## 2021-04-18 NOTE — MAU Note (Signed)
Gina [name] is a 25 y.o. at [redacted]w[redacted]d here in [facility]U reporting: cramping that started over the weekend. Has been dizzy and lightheaded. No bleeding or abnormal discharge. Has been to [facility] office and last Friday hcg was 48250 and then yesterday hcg was 5000.  LMP: 01/17/21  [facility]/s when she was 6w and that her due date is 11/14/21. States they told her that everything looked good on the ultrasound and that they saw a heartbeat.  Onset of complaint: ongoing  Pain score: 0/10  Vitals:   04/18/21 1511  BP: 105/78  Pulse: 72  Resp: 16  Temp: 98.9 F (37.2 C)  SpO2: 99%      Lab orders placed from triage: UPT, UA

## 2021-04-18 NOTE — MAU Provider Note (Signed)
Chief Complaint: Abdominal Pain  SUBJECTIVE HPI: Gina [name] is a 25 y.o. G2P1001 at [redacted]w[redacted]d by 6 week ultrasound who presents to maternity admissions reporting concern of abdominal cramping for several days. Of note, pt was previously seen at [facility] where an intrauterine pregnancy with +FHTs was visualized at approximately 6 weeks. Since then, she has had several beta hCG levels drawn in clinic. Per pt report, beta hCG level 13,000 on 4/29, down-trending to 5,000 on 5/2. Pt is very anxious given this is a highly desired pregnancy. No severe abdominal pain or bleeding currently. She presents to MAU with her partner today.  She denies vaginal bleeding, vaginal itching/burning, urinary symptoms, h/a, dizziness, n/v, or fever/chills.  Past Medical History:  Diagnosis Date  . Depression   . Headache   . Need for varicella vaccine   . Pyelonephritis   . Restless leg syndrome    Past Surgical History:  Procedure Laterality Date  . NOSE SURGERY     Social History   Socioeconomic History  . Marital status: Single    Spouse name: Not on file  . Number of children: Not on file  . Years of education: Not on file  . Highest education level: Not on file  Occupational History  . Not on file  Tobacco Use  . Smoking status: Never Smoker  . Smokeless tobacco: Never Used  Substance and Sexual Activity  . Alcohol use: No    Alcohol/week: 0.0 standard drinks  . Drug use: No  . Sexual activity: Not Currently  Other Topics Concern  . Not on file  Social History Narrative  . Not on file   Social Determinants of Health   Financial Resource Strain: Not on file  Food Insecurity: Not on file  Transportation Needs: Not on file  Physical Activity: Not on file  Stress: Not on file  Social Connections: Not on file  Intimate Partner Violence: Not on file   No current facility-administered medications on file prior to encounter.   No current outpatient medications on file prior  to encounter.   No Known Allergies  ROS:  Review of Systems  Constitutional: Negative for chills, fatigue and fever.  HENT: Negative for congestion and sore throat.   Eyes: Negative for photophobia and visual disturbance.  Respiratory: Negative for chest tightness and shortness of breath.   Cardiovascular: Negative for chest pain and palpitations.  Gastrointestinal: Positive for abdominal pain. Negative for diarrhea, nausea and vomiting.  Genitourinary: Negative for dysuria, vaginal bleeding, vaginal discharge and vaginal pain.  Musculoskeletal: Negative for back pain and myalgias.  Neurological: Negative for seizures and headaches.   I have reviewed patient's Past Medical Hx, Surgical Hx, Family Hx, Social Hx, medications and allergies.   Physical Exam   No data found. Constitutional: Well-developed, well-nourished female in no acute distress. Cardiovascular: normal rate Respiratory: normal effort GI: non-distended. MS: normal ROM Neurologic: Alert and oriented x 4.  GU: deferred per pt preference. Psych: tearful and anxious  Unable to obtain FHTs on Doppler.  LAB RESULTS No results found for this or any previous visit (from the past 24 hour(s)).    IMAGING US OB LESS THAN 14 WEEKS WITH OB TRANSVAGINAL  Result Date: 04/18/2021 CLINICAL DATA:  Early pregnancy EXAM: OBSTETRIC <14 WK [place] AND TRANSVAGINAL OB US TECHNIQUE: Both transabdominal and transvaginal ultrasound examinations were performed for complete evaluation of the gestation as well as the maternal uterus, adnexal regions, and pelvic cul-de-sac. Transvaginal technique was performed to assess early  pregnancy. COMPARISON:  None. FINDINGS: Intrauterine gestational sac: Single Yolk sac:  Visualized Embryo:  Visualized Cardiac Activity: Not visualized CRL: 16.8 mm   8 w   0 d                  [place] EDC: 11/28/2021 Subchorionic hemorrhage:  None visualized. Maternal uterus/adnexae: Ovaries are within normal limits. The right  ovary measures 4.7 x 2.6 x 2.4 cm. The left ovary measures 3.2 x 2 x 1.9 cm. No significant free fluid. IMPRESSION: Single intrauterine gestation with visible embryo but no cardiac activity, average crown-rump length of 16.8 mm. Findings meet definitive criteria for failed pregnancy. This follows SRU consensus guidelines: Diagnostic Criteria for Nonviable Pregnancy Early in the First Trimester. Macy Mis J Med [phone]. Electronically Signed   By: Jasmine Pang M.D.   On: 04/18/2021 18:39    MAU Management/MDM: Orders Placed This Encounter  Procedures  . US OB LESS THAN 14 WEEKS WITH OB TRANSVAGINAL  . Urinalysis, Routine w reflex microscopic Urine, Clean Catch  . hCG, quantitative, pregnancy  . Discharge patient Discharge disposition: 01-Home or Self Care; Discharge patient date: 04/18/2021    Meds ordered this encounter  Medications  . DISCONTD: misoprostol (CYTOTEC) 200 MCG tablet    Sig: Take 4 tablets (800 mcg total) by mouth once for 1 dose.    Dispense:  4 tablet    Refill:  0  . oxyCODONE-acetaminophen (PERCOCET) 5-325 MG tablet    Sig: Take 1 tablet by mouth every 6 (six) hours as needed for moderate pain or severe pain.    Dispense:  5 tablet    Refill:  0  . promethazine (PHENERGAN) 12.5 MG tablet    Sig: Take 1 tablet (12.5 mg total) by mouth every 8 (eight) hours as needed for nausea or vomiting.    Dispense:  10 tablet    Refill:  0  . misoprostol (CYTOTEC) 200 MCG tablet    Sig: Place 4 tablets (800 mcg total) vaginally once for 1 dose.    Dispense:  4 tablet    Refill:  0    Discussed patient and results of beta-hcg and ultrasound with Dr. Donavan Foil and Dr. Connye Burkitt. Treatments in MAU included: none. Pt discharged with strict return precautions for severe abdominal pain, heavy vaginal bleeding or other concerns.  ASSESSMENT & PLAN:  Gina [name] is a 25 y.o. G2P1001 at [redacted]w[redacted]d by 6 week ultrasound who presents to maternity admissions reporting concern of abdominal  cramping for several days. Ultrasound in MAU today confirms failed pregnancy. S/p conversation with Dr. Donavan Foil and Dr. Connye Burkitt, discussed options with pt who elects for cytotec. O+ blood type. 1. Spontaneous pregnancy loss   2. Bloody show and cramping in early pregnancy   - prescribed vaginal cytotec; provided verbal and written info on what to expect - will coordinate follow-up at [facility] per pt request for follow-up beta-HCG - Discharge home  Allergies as of 04/18/2021   No Known Allergies     Medication List    STOP taking these medications   amoxicillin-clavulanate 875-125 MG tablet Commonly known as: AUGMENTIN     TAKE these medications   misoprostol 200 MCG tablet Commonly known as: Cytotec Place 4 tablets (800 mcg total) vaginally once for 1 dose.   oxyCODONE-acetaminophen 5-325 MG tablet Commonly known as: Percocet Take 1 tablet by mouth every 6 (six) hours as needed for moderate pain or severe pain.   promethazine 12.5 MG tablet Commonly known as: PHENERGAN  Take 1 tablet (12.5 mg total) by mouth every 8 (eight) hours as needed for nausea or vomiting.     Sheila Oats, MD OB Fellow, Faculty Practice

## 2021-04-27 ENCOUNTER — Telehealth: Payer: Self-pay | Admitting: Obstetrics and Gynecology

## 2021-04-27 NOTE — Telephone Encounter (Signed)
Saw pt in MAU last week with confirmed pregnancy loss on ultrasound. Prescribed cytotec s/p shared decision making with pt. Called pt today to check in. Pt reported passage of pregnancy s/p cytotec on 5/7. No questions or concerns at this time.  Sheila Oats, MD OB Fellow, Faculty Practice 04/27/2021 10:18 AM

## 2021-11-17 IMAGING — US US OB < 14 WEEKS - US OB TV
1 series · 15 of 28 positions shown · non-contrast
Comparison: None.

CLINICAL DATA: Early pregnancy

EXAM:
OBSTETRIC <14 WK US AND TRANSVAGINAL OB US
TECHNIQUE: Both transabdominal and transvaginal ultrasound examinations were
performed for complete evaluation of the gestation as well as the
maternal uterus, adnexal regions, and pelvic cul-de-sac.
Transvaginal technique was performed to assess early pregnancy.

[Series 1: us ob < 14 weeks - us ob tv · 56 acquisitions, 15 frames shown]
[im 1/56]
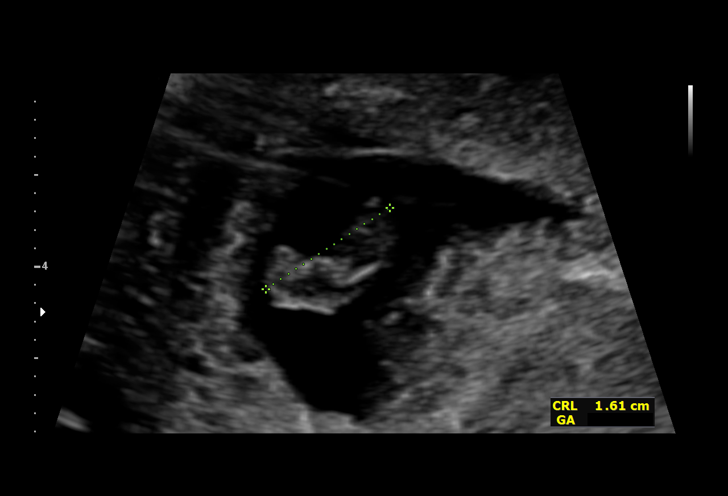
[im 5/56]
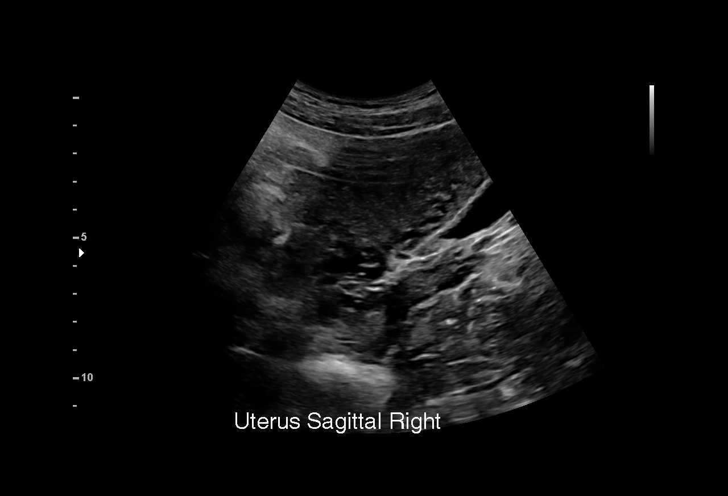
[im 9/56]
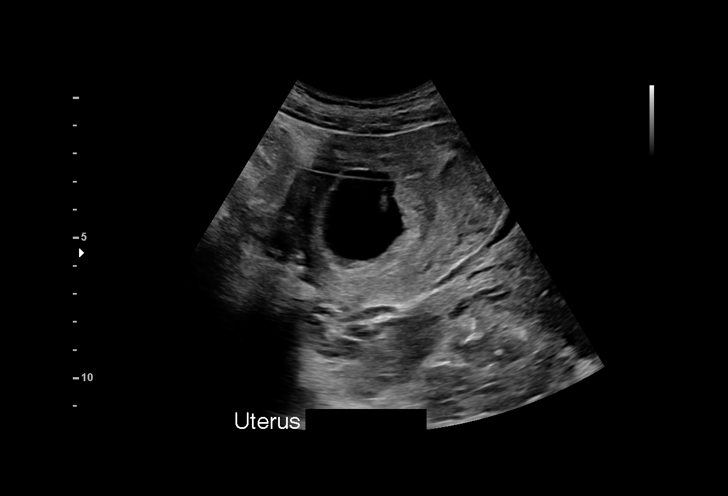
[im 13/56]
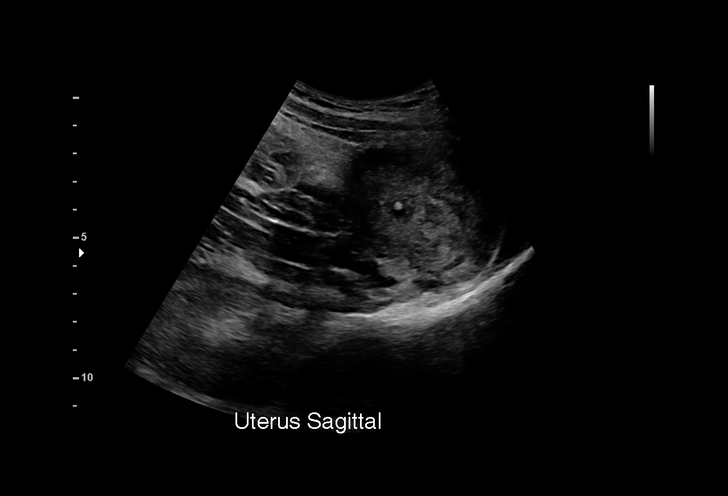
[im 17/56]
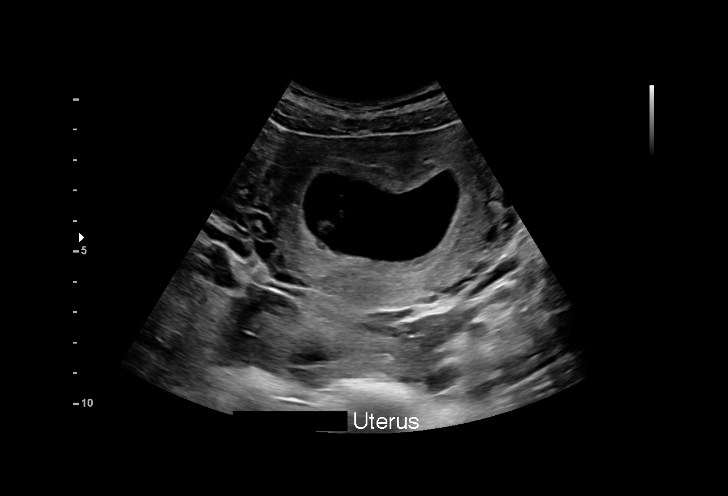
[im 21/56]
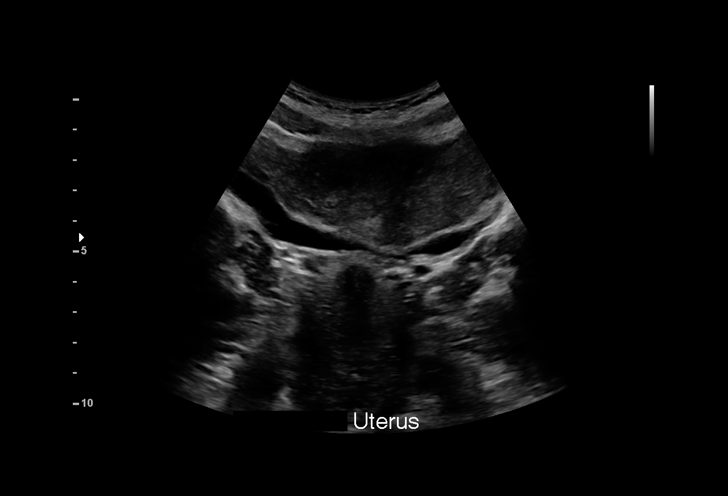
[im 25/56]
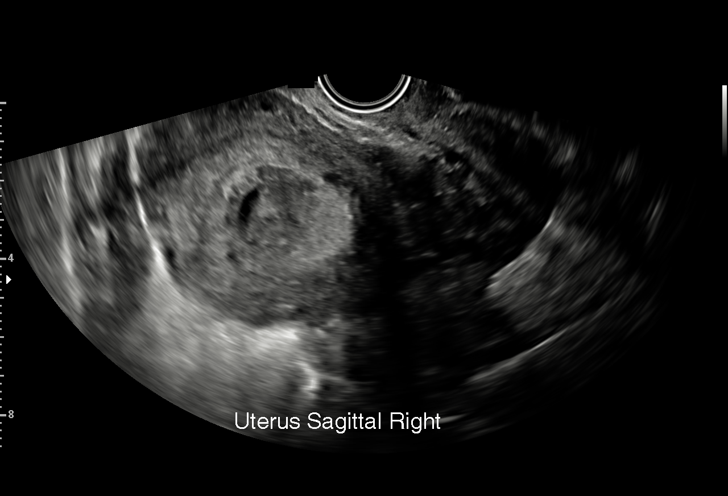
[im 29/56]
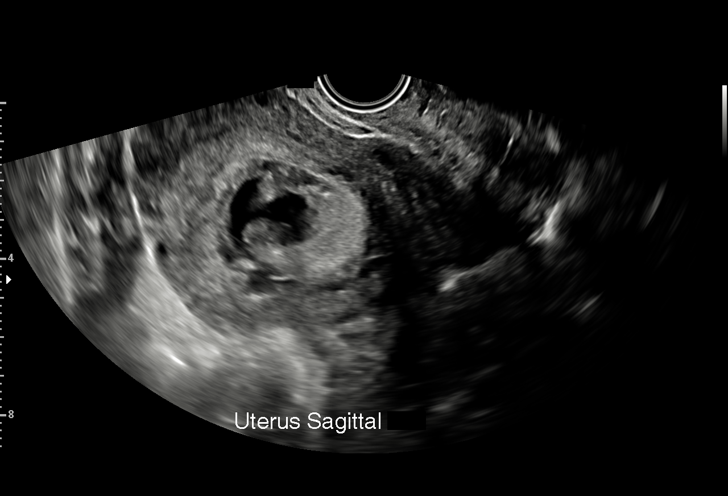
[im 31/56]
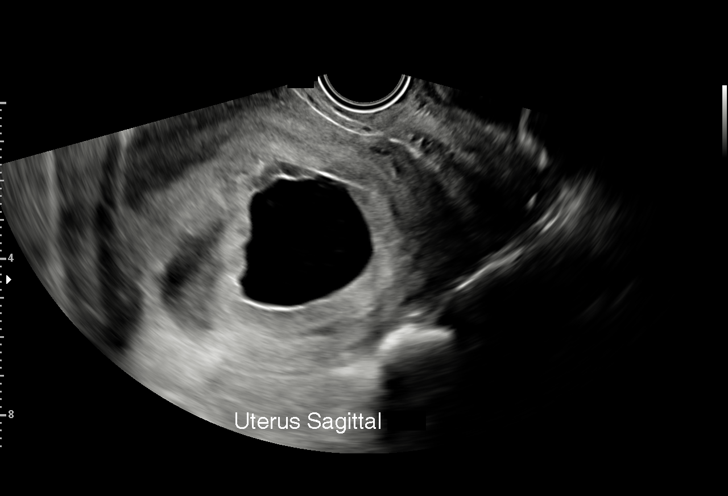
[im 35/56]
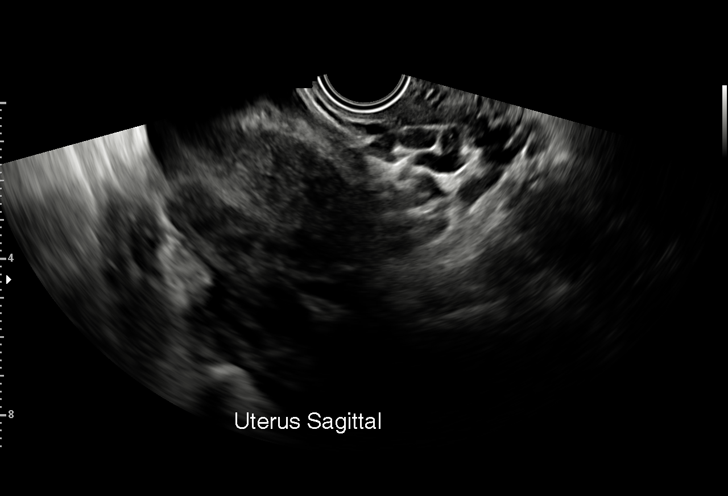
[im 39/56]
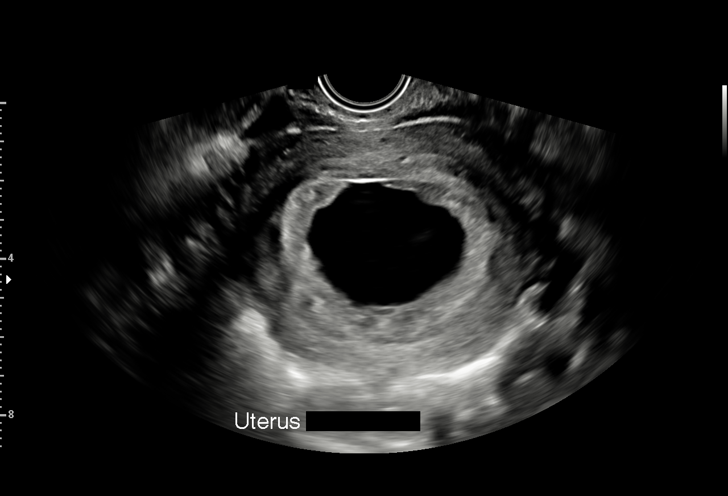
[im 43/56]
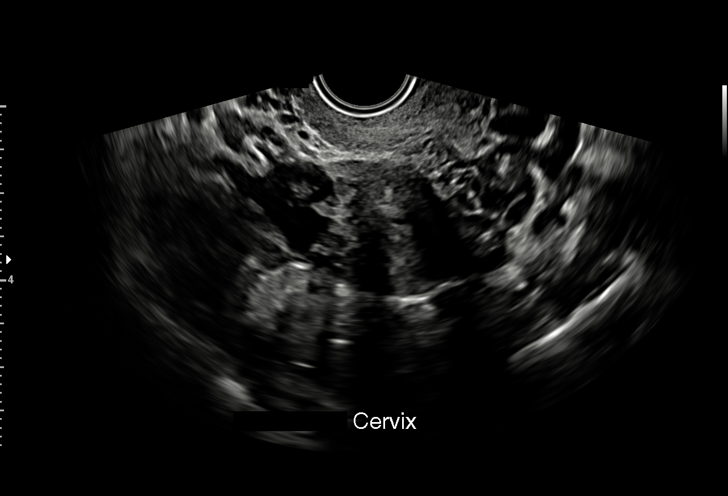
[im 47/56]
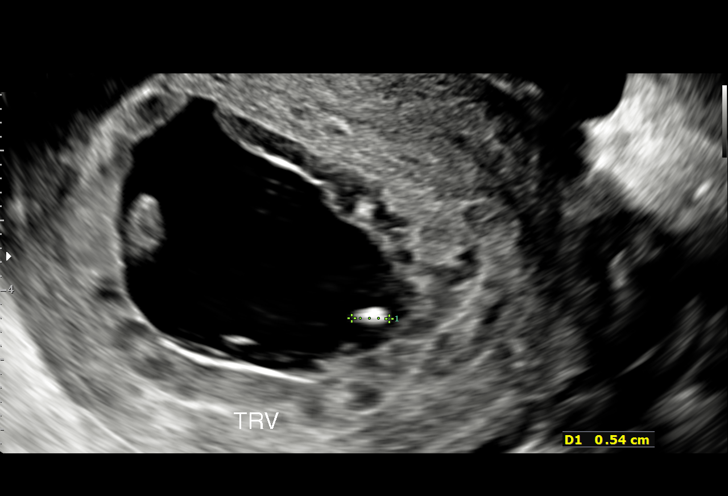
[im 51/56]
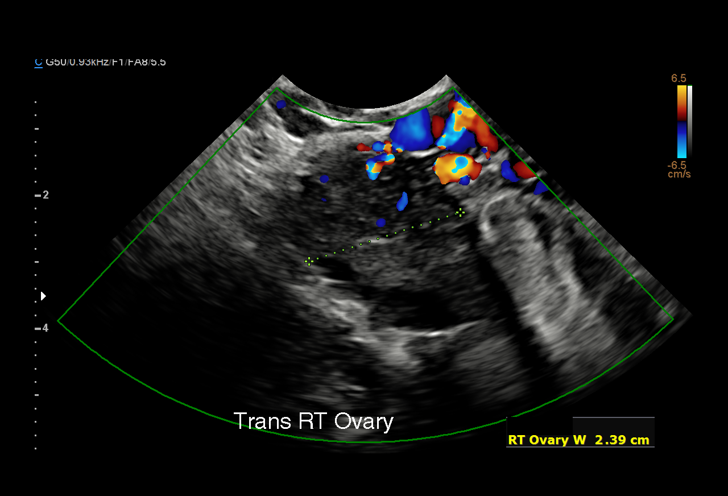
[im 56/56]
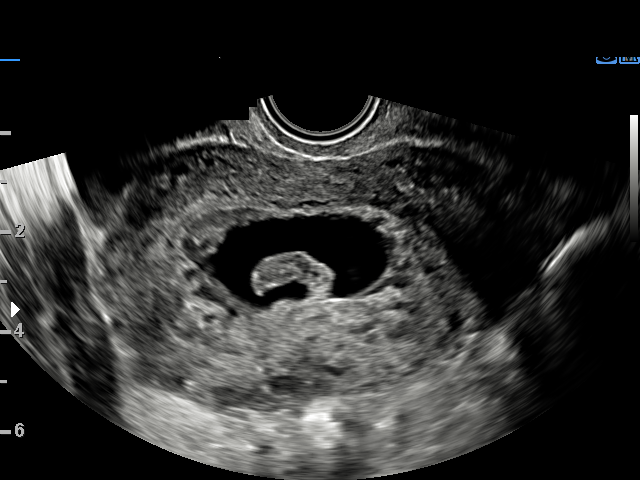

[15 of 28 positions shown; findings below may reference images not displayed]

FINDINGS: Intrauterine gestational sac: Single

Yolk sac:  Visualized

Embryo:  Visualized

Cardiac Activity: Not visualized

CRL: 16.8 mm   8 w   0 d                  US EDC: 11/28/2021

Subchorionic hemorrhage:  None visualized.

Maternal uterus/adnexae: Ovaries are within normal limits. The right
ovary measures 4.7 x 2.6 x 2.4 cm. The left ovary measures 3.2 x 2 x
1.9 cm. No significant free fluid.
IMPRESSION: Single intrauterine gestation with visible embryo but no cardiac
activity, average crown-rump length of 16.8 mm. Findings meet
definitive criteria for failed pregnancy. This follows SRU consensus
guidelines: Diagnostic Criteria for Nonviable Pregnancy Early in the
First Trimester. N Engl J Med 0620;[DATE].

## 2022-04-18 DIAGNOSIS — I951 Orthostatic hypotension: Secondary | ICD-10-CM | POA: Insufficient documentation

## 2022-04-18 DIAGNOSIS — G90A Postural orthostatic tachycardia syndrome (POTS): Secondary | ICD-10-CM | POA: Insufficient documentation

## 2022-12-25 DIAGNOSIS — M25511 Pain in right shoulder: Secondary | ICD-10-CM | POA: Insufficient documentation

## 2022-12-25 DIAGNOSIS — M25512 Pain in left shoulder: Secondary | ICD-10-CM

## 2022-12-31 LAB — OB RESULTS CONSOLE RPR: RPR: NONREACTIVE

## 2022-12-31 LAB — HEPATITIS C ANTIBODY: HCV Ab: NEGATIVE

## 2022-12-31 LAB — OB RESULTS CONSOLE HIV ANTIBODY (ROUTINE TESTING): HIV: NONREACTIVE

## 2023-03-05 LAB — OB RESULTS CONSOLE TSH: TSH: 2.07

## 2023-05-27 LAB — OB RESULTS CONSOLE HGB/HCT, BLOOD
HCT: 39 (ref 29–41)
Hemoglobin: 12.8

## 2023-05-27 LAB — OB RESULTS CONSOLE ABO/RH: RH Type: POSITIVE

## 2023-05-27 LAB — OB RESULTS CONSOLE ANTIBODY SCREEN: Antibody Screen: NEGATIVE

## 2023-05-27 LAB — OB RESULTS CONSOLE RUBELLA ANTIBODY, IGM: Rubella: IMMUNE

## 2023-05-27 LAB — OB RESULTS CONSOLE GC/CHLAMYDIA
Chlamydia: NEGATIVE
Neisseria Gonorrhea: NEGATIVE

## 2023-05-27 LAB — OB RESULTS CONSOLE HEPATITIS B SURFACE ANTIGEN: Hepatitis B Surface Ag: NEGATIVE

## 2023-05-27 LAB — OB RESULTS CONSOLE PLATELET COUNT: Platelets: 320

## 2023-05-27 LAB — OB RESULTS CONSOLE RPR: RPR: NONREACTIVE

## 2023-05-27 LAB — OB RESULTS CONSOLE HIV ANTIBODY (ROUTINE TESTING): HIV: NONREACTIVE

## 2023-06-27 DIAGNOSIS — N930 Postcoital and contact bleeding: Secondary | ICD-10-CM | POA: Insufficient documentation

## 2023-06-27 DIAGNOSIS — O480 Post-term pregnancy: Secondary | ICD-10-CM | POA: Insufficient documentation

## 2023-06-27 DIAGNOSIS — R5383 Other fatigue: Secondary | ICD-10-CM | POA: Insufficient documentation

## 2023-06-27 DIAGNOSIS — R002 Palpitations: Secondary | ICD-10-CM | POA: Insufficient documentation

## 2023-06-27 DIAGNOSIS — N926 Irregular menstruation, unspecified: Secondary | ICD-10-CM | POA: Insufficient documentation

## 2023-06-27 DIAGNOSIS — N912 Amenorrhea, unspecified: Secondary | ICD-10-CM | POA: Insufficient documentation

## 2023-06-27 DIAGNOSIS — Z3480 Encounter for supervision of other normal pregnancy, unspecified trimester: Secondary | ICD-10-CM | POA: Insufficient documentation

## 2023-06-27 DIAGNOSIS — B3731 Acute candidiasis of vulva and vagina: Secondary | ICD-10-CM | POA: Insufficient documentation

## 2023-06-27 DIAGNOSIS — J00 Acute nasopharyngitis [common cold]: Secondary | ICD-10-CM | POA: Insufficient documentation

## 2023-07-10 ENCOUNTER — Telehealth (INDEPENDENT_AMBULATORY_CARE_PROVIDER_SITE_OTHER): Payer: Medicaid Other | Admitting: *Deleted

## 2023-07-10 ENCOUNTER — Encounter: Payer: Self-pay | Admitting: *Deleted

## 2023-07-10 DIAGNOSIS — O0990 Supervision of high risk pregnancy, unspecified, unspecified trimester: Secondary | ICD-10-CM | POA: Insufficient documentation

## 2023-07-10 DIAGNOSIS — Z3689 Encounter for other specified antenatal screening: Secondary | ICD-10-CM

## 2023-07-10 DIAGNOSIS — Z3490 Encounter for supervision of normal pregnancy, unspecified, unspecified trimester: Secondary | ICD-10-CM

## 2023-07-10 DIAGNOSIS — G90A Postural orthostatic tachycardia syndrome (POTS): Secondary | ICD-10-CM

## 2023-07-10 MED ORDER — GOJJI WEIGHT SCALE MISC: 1.0000 | 0 refills | Status: DC

## 2023-07-10 NOTE — Progress Notes (Addendum)
New OB Intake  I connected with Gina [name]  on 07/10/23 at 9:28am  by MyChart Video Visit and verified that I am speaking with the correct person using two identifiers. Nurse is located at [facility] and pt is located at home .  I discussed the limitations, risks, security and privacy concerns of performing an evaluation and management service by telephone and the availability of in person appointments. I also discussed with the patient that there may be a patient responsible charge related to this service. The patient expressed understanding and agreed to proceed.  I explained I am completing New OB Intake today.She is transferring her care from Dr. Estanislado Pandy who does not do OB any longer.  We discussed EDD of 01/15/24 based on [place]. Pt is [id number]. I reviewed her allergies, medications and Medical/Surgical/OB history.    Patient Active Problem List   Diagnosis Date Noted   Supervision of high risk pregnancy, antepartum 07/10/2023   POTS (postural orthostatic tachycardia syndrome) 04/18/2022   H/O: depression--dx 2015 03/23/2015    Concerns addressed today  Delivery Plans Plans to deliver at [facility]. Discussed the nature of our practice with multiple providers including residents and students. Due to the size of the practice, the delivering provider may not be the same as those providing prenatal care.   Patient is interested in water birth. Offered upcoming OB visit with CNM to discuss further.  MyChart/Babyscripts MyChart access verified. I explained pt will have some visits in office and some virtually. Babyscripts instructions given and order placed.  Blood Pressure Cuff/Weight Scale She has bp cuff.  Explained after first prenatal appt pt will check weekly and document in Babyscripts. Patient does not have weight scale; order sent to [facility], patient may track weight weekly in Babyscripts.  Anatomy US Explained first scheduled [place] will be around 19 weeks. Anatomy US scheduled  for 08/21/23 at 1:00.  Is patient a CenteringPregnancy candidate?  Not a candidate due to POTS.   Is patient a Mom+Baby Combined Care candidate?  Not a candidate    Interested in [place]? Not at this time.   First visit review I reviewed new OB appt with patient. Explained pt will be seen by Misty Stanley Leftwich-Kirby,CNM at first visit. Discussed Avelina Laine genetic screening with patient. She would like  Panorama drawn with routine prenatal labs at new ob visit.  She also informed me Dr. Estanislado Pandy has referred her to neurology in September.   Last Pap No results found for: "DIAGPAP"  Nancy Fetter 07/10/2023  12:05 PM

## 2023-07-15 ENCOUNTER — Other Ambulatory Visit: Payer: Self-pay

## 2023-07-15 ENCOUNTER — Encounter: Payer: Self-pay | Admitting: Advanced Practice Midwife

## 2023-07-15 ENCOUNTER — Ambulatory Visit: Payer: Medicaid Other | Admitting: Advanced Practice Midwife

## 2023-07-15 VITALS — BP 100/68 | HR 83 | Wt 125.0 lb

## 2023-07-15 DIAGNOSIS — Z3A13 13 weeks gestation of pregnancy: Secondary | ICD-10-CM

## 2023-07-15 DIAGNOSIS — Z3490 Encounter for supervision of normal pregnancy, unspecified, unspecified trimester: Secondary | ICD-10-CM

## 2023-07-15 DIAGNOSIS — Z3491 Encounter for supervision of normal pregnancy, unspecified, first trimester: Secondary | ICD-10-CM | POA: Diagnosis not present

## 2023-07-15 DIAGNOSIS — I951 Orthostatic hypotension: Secondary | ICD-10-CM

## 2023-07-15 NOTE — Progress Notes (Signed)
Subjective:   Gina [name] is a 27 y.o. [id number] at [redacted]w[redacted]d by early ultrasound being seen today for her first obstetrical visit.  Her obstetrical history is significant for  vaginal delivery x 2 without complication  and has H/O: depression--dx 2015; POTS (postural orthostatic tachycardia syndrome); and Supervision of high risk pregnancy, antepartum on their problem list.. Patient does intend to breast feed. Pregnancy history fully reviewed.  Patient reports no complaints.  HISTORY: OB History  Gravida Para Term Preterm AB Living  4 2 2  0 1 2  SAB IAB Ectopic Multiple Live Births  1 0 0 0 2    # Outcome Date GA Lbr Len/2nd Weight Sex Type Anes PTL Lv  4 Current           3 SAB 2022 [redacted]w[redacted]d         2 Term 07/06/17 [redacted]w[redacted]d  7 lb 12 oz (3.515 kg) F Vag-Spont None  LIV     Birth Comments: fainted often during pregnancy, low BP  1 Term 03/23/15 [redacted]w[redacted]d 06:07 / 00:06 6 lb 11 oz (3.033 kg) F Vag-Spont None  LIV     Apgar1: 9  Apgar5: 9   Past Medical History:  Diagnosis Date   Acute cystitis 05/21/2017   Amenorrhea 06/27/2023   Candidal vulvovaginitis 06/27/2023   Common cold 06/27/2023   Complication of anesthesia    dental implant anes had to be repeated numberous times   Depression    Fatigue 06/27/2023   Headache    Intrauterine pregnancy in teenager 06/27/2023   Irregular periods 06/27/2023   After Depo-Provera use     Need for varicella vaccine    Pain in joint of left shoulder 12/25/2022   Palpitations 06/27/2023   TSH pending 10/9     Post-term pregnancy 06/27/2023   Postcoital bleeding 06/27/2023   G     POTS (postural orthostatic tachycardia syndrome)    Pregnancy 07/06/2017   Pyelonephritis    Restless leg syndrome    Syncope 05/21/2017   Past Surgical History:  Procedure Laterality Date   DENTAL SURGERY  2017   dental implant   NOSE SURGERY  2021   not sure of date   WISDOM TOOTH EXTRACTION Bilateral 2014   Family History  Problem Relation Age of Onset    Anxiety disorder Mother    Migraines Neg Hx    Social History   Tobacco Use   Smoking status: Never   Smokeless tobacco: Never  Vaping Use   Vaping status: Never Used  Substance Use Topics   Alcohol use: Not Currently    Comment: not drank alcohlol for years   Drug use: Never   No Known Allergies Current Outpatient Medications on File Prior to Visit  Medication Sig Dispense Refill   Prenatal Vit-Fe Fumarate-FA (PRENATAL VITAMIN PO) Take 1 tablet by mouth daily at 6 (six) AM.     amoxicillin (AMOXIL) 500 MG capsule      azithromycin (ZITHROMAX) 250 MG tablet      buPROPion (WELLBUTRIN XL) 150 MG 24 hr tablet      ciprofloxacin (CIPRO) 500 MG tablet      cyclobenzaprine (FLEXERIL) 5 MG tablet Take by mouth.     ferrous sulfate 325 (65 FE) MG EC tablet TAKE 1 TABLET BY MOUTH DAILY WITH BREAKFAST AND ORANGE JUICE FOR BETTER ABSORPTION     HYDROcodone-acetaminophen (NORCO) 7.5-325 MG tablet      hydrOXYzine (ATARAX) 10 MG tablet TAKE 1 TO 2  TABLETS BY MOUTH EVERY 4 TO 6 HOURS AS NEEDED FOR ITCHING     ibuprofen (ADVIL) 600 MG tablet take 1 tablet by mouth every 6 hours with food for 5 days     ketorolac (TORADOL) 10 MG tablet      Magnesium 200 MG TABS Take 1 tablet every day by oral route with meals for 30 days.     Misc. Devices (GOJJI WEIGHT SCALE) MISC 1 Device by Does not apply route once a week. 1 each 0   nystatin ointment (MYCOSTATIN) APPLY TOPICALLY TO AFFECTED AREA TWICE DAILY AS NEEDED.     ondansetron (ZOFRAN-ODT) 4 MG disintegrating tablet      progesterone (PROMETRIUM) 200 MG capsule TAKE 1 CAPSULE BY MOUTH EVERY NIGHT AT BEDTIME FOR 12 DAYS EVERY 45 DAYS AS NEEDED     promethazine (PHENERGAN) 12.5 MG tablet Take 1 tablet (12.5 mg total) by mouth every 8 (eight) hours as needed for nausea or vomiting. 10 tablet 0   rOPINIRole (REQUIP) 2 MG tablet      triamcinolone (KENALOG) 0.025 % cream APPLY SPARINGLY TOPICALLY TO THE AFFECTED AREA THREE TIMES DAILY     triamcinolone  ointment (KENALOG) 0.1 % APPLY TOPICALLY TO AFFECTED AREA TWICE DAILY AS NEEDED.     valACYclovir (VALTREX) 1000 MG tablet Take 1 tablet by mouth every 12 (twelve) hours.     No current facility-administered medications on file prior to visit.     Indications for ASA therapy (per uptodate) One of the following: Previous pregnancy with preeclampsia, especially early onset and with an adverse outcome No Multifetal gestation No Chronic hypertension No Type 1 or 2 diabetes mellitus No Chronic kidney disease No Autoimmune disease (antiphospholipid syndrome, systemic lupus erythematosus) No   Two or more of the following: Nulliparity No Obesity (body mass index >30 kg/m2) No Family history of preeclampsia in mother or sister No Age ?35 years No Sociodemographic characteristics (African American race, low socioeconomic level) No Personal risk factors (eg, previous pregnancy with low birth weight or small for gestational age infant, previous adverse pregnancy outcome [eg, stillbirth], interval >10 years between pregnancies) No   Indications for early 1 hour GTT (per uptodate)  BMI >25 (>23 in Asian women) AND one of the following  No BMI 20   Exam   Vitals:   07/15/23 1414  BP: 100/68  Pulse: 83  Weight: 125 lb (56.7 kg)   Fetal Heart Rate (bpm): 156  Uterus:     Pelvic Exam: Perineum: no hemorrhoids, normal perineum   Vulva: normal external genitalia, no lesions   Vagina:  normal mucosa, normal discharge   Cervix: no lesions and normal, pap smear done.    Adnexa: normal adnexa and no mass, fullness, tenderness   Bony Pelvis: average  System: General: well-developed, well-nourished female in no acute distress   Breast:  normal appearance, no masses or tenderness   Skin: normal coloration and turgor, no rashes   Neurologic: oriented, normal, negative, normal mood   Extremities: normal strength, tone, and muscle mass, ROM of all joints is normal   HEENT PERRLA, extraocular  movement intact and sclera clear, anicteric   Mouth/Teeth mucous membranes moist, pharynx normal without lesions and dental hygiene good   Neck supple and no masses   Cardiovascular: regular rate and rhythm   Respiratory:  no respiratory distress, normal breath sounds   Abdomen: soft, non-tender; bowel sounds normal; no masses,  no organomegaly     Assessment:   Pregnancy: [id number] Patient  Active Problem List   Diagnosis Date Noted   Supervision of high risk pregnancy, antepartum 07/10/2023   POTS (postural orthostatic tachycardia syndrome) 04/18/2022   H/O: depression--dx 2015 03/23/2015     Plan:  1. Encounter for supervision of low-risk pregnancy, antepartum --Anticipatory guidance about next visits/weeks of pregnancy given.  --Labs from previous provider reviewed, unable to locate CBC, urine culture so drawn today  - CBC - Culture, OB Urine  2. Orthostatic hypotension --Pt reports possible POTS but not formally diagnosed, mild symptoms outside of pregnancy but symptomatic in pregnancy --Pt has referral to neuro from previous OB for symptoms --Discussed increased water intake and sodium if needed, pt is monitoring BP and heartrate at home - AMB Referral to Cardio Obstetrics  3. [redacted] weeks gestation of pregnancy      Initial labs reviewed Continue prenatal vitamins. Ultrasound discussed; fetal anatomic survey: ordered. Problem list reviewed and updated. The nature of [facility] with multiple MDs and other Advanced Practice Providers was explained to patient; also emphasized that residents, students are part of our team. Routine obstetric precautions reviewed. Return in 4 weeks (on 08/12/2023) for Midwife preferred, LOB.   Sharen Counter, CNM 07/15/23 5:52 PM

## 2023-08-12 ENCOUNTER — Ambulatory Visit (INDEPENDENT_AMBULATORY_CARE_PROVIDER_SITE_OTHER): Payer: Medicaid Other

## 2023-08-12 ENCOUNTER — Ambulatory Visit: Payer: Medicaid Other | Attending: Cardiology | Admitting: Cardiology

## 2023-08-12 VITALS — BP 100/58 | HR 77 | Ht 65.0 in | Wt 129.6 lb

## 2023-08-12 DIAGNOSIS — R42 Dizziness and giddiness: Secondary | ICD-10-CM

## 2023-08-12 DIAGNOSIS — I951 Orthostatic hypotension: Secondary | ICD-10-CM | POA: Diagnosis not present

## 2023-08-12 DIAGNOSIS — Z3A17 17 weeks gestation of pregnancy: Secondary | ICD-10-CM

## 2023-08-12 NOTE — Patient Instructions (Signed)
Medication Instructions:  No changes *If you need a refill on your cardiac medications before your next appointment, please call your pharmacy*  Testing/Procedures: Your physician has requested that you have an echocardiogram. Echocardiography is a painless test that uses sound waves to create images of your heart. It provides your doctor with information about the size and shape of your heart and how well your heart's chambers and valves are working. This procedure takes approximately one hour. There are no restrictions for this procedure. Please do NOT wear cologne, perfume, aftershave, or lotions (deodorant is allowed). Please arrive 15 minutes prior to your appointment time.   Your physician has recommended that you wear a 14 DAY ZIO-PATCH monitor. The Zio patch cardiac monitor continuously records heart rhythm data for up to 14 days, this is for patients being evaluated for multiple types heart rhythms. For the first 24 hours post application, please avoid getting the Zio monitor wet in the shower or by excessive sweating during exercise. After that, feel free to carry on with regular activities. Keep soaps and lotions away from the ZIO XT Patch.  This will be mailed to you, please expect 7-10 days to receive.    Applying the monitor   Shave hair from upper left chest.   Hold abrader disc by orange tab.  Rub abrader in 40 strokes over left upper chest as indicated in your monitor instructions.   Clean area with 4 enclosed alcohol pads .  Use all pads to assure are is cleaned thoroughly.  Let dry.   Apply patch as indicated in monitor instructions.  Patch will be place under collarbone on left side of chest with arrow pointing upward.   Rub patch adhesive wings for 2 minutes.Remove white label marked "1".  Remove white label marked "2".  Rub patch adhesive wings for 2 additional minutes.   While looking in a mirror, press and release button in center of patch.  A small green light will  flash 3-4 times .  This will be your only indicator the monitor has been turned on.     Do not shower for the first 24 hours.  You may shower after the first 24 hours.   Press button if you feel a symptom. You will hear a small click.  Record Date, Time and Symptom in the Patient Log Book.   When you are ready to remove patch, follow instructions on last 2 pages of Patient Log Book.  Stick patch monitor onto last page of Patient Log Book.   Place Patient Log Book in [place] box.  Use locking tab on box and tape box closed securely.  The [employer] and [employer] has [employer] on it.  Please place in mailbox as soon as possible.  Your physician should have your test results approximately 7 days after the monitor has been mailed back to [facility].   Call [facility] Customer Care at [phone] if you have questions regarding your ZIO XT patch monitor.  Call them immediately if you see an orange light blinking on your monitor.   If your monitor falls off in less than 4 days contact our Monitor department at [phone].  If your monitor becomes loose or falls off after 4 days call [employer] at [phone] for suggestions on securing your monitor    Follow-Up: At [facility], you and your health needs are our priority.  As part of our continuing mission to provide you with exceptional heart care, we have created designated Provider  Care Teams.  These Care Teams include your primary Cardiologist (physician) and Advanced Practice Providers (APPs -  Physician Assistants and Nurse Practitioners) who all work together to provide you with the care you need, when you need it.  We recommend signing up for the patient portal called "MyChart".  Sign up information is provided on this After Visit Summary.  MyChart is used to connect with patients for Virtual Visits (Telemedicine).  Patients are able to view lab/test results, encounter notes, upcoming appointments, etc.  Non-urgent messages  can be sent to your provider as well.   To learn more about what you can do with MyChart, go to [email or web address].    Your next appointment:   8 week(s)  Provider:   None     Other Instructions SALTINES:- Eat 2 packets in the morning/breakfast                     Eat 1 packet at lunch                     Eat 1 packet at dinner

## 2023-08-12 NOTE — Progress Notes (Unsigned)
Enrolled for [employer] to mail a ZIO XT long term holter monitor to the patients address on file.  

## 2023-08-12 NOTE — Progress Notes (Unsigned)
Cardio[facility]Obstetrics [facility]  New Evaluation  Date:  08/13/2023   ID:  Gina [name], DOB [birth year], MRN [id number]  PCP:  Patient, No Pcp Per   [facility] Providers Cardiologist:  Thomasene Ripple, DO  Electrophysiologist:  None       Referring MD: Hurshel Party,*   Chief Complaint: " I am having lightheadedness"  History of Present Illness:    Gina [name] is a 27 y.o. female [G4P2012] who is being seen today for the evaluation of syncope at the request of Leftwich-Kirby, Wilmer Floor,*.   Medical hx includes Depression, status post tilt table test and was told she did not have POTS is here today to be evaluated for worsening palpitations and presyncope episode.   She is currently 17 weeks pregnancy. She reports that she has been experiencing intermittent abrupt onset of fast heart rate. She tells me that this has been happening for several years even prior to pregnancy. This is worsening . Recently she reports that she is not experiencing lightheadedness with this . Has not passed out yet.  Prior CV Studies Reviewed: The following studies were reviewed today: None   Past Medical History:  Diagnosis Date   Acute cystitis 05/21/2017   Amenorrhea 06/27/2023   Candidal vulvovaginitis 06/27/2023   Common cold 06/27/2023   Complication of anesthesia    dental implant anes had to be repeated numberous times   Depression    Fatigue 06/27/2023   Headache    Intrauterine pregnancy in teenager 06/27/2023   Irregular periods 06/27/2023   After Depo-Provera use     Need for varicella vaccine    Pain in joint of left shoulder 12/25/2022   Palpitations 06/27/2023   TSH pending 10/9     Post-term pregnancy 06/27/2023   Postcoital bleeding 06/27/2023   G     POTS (postural orthostatic tachycardia syndrome)    Pregnancy 07/06/2017   Pyelonephritis    Restless leg syndrome    Syncope 05/21/2017    Past Surgical History:  Procedure Laterality Date   DENTAL SURGERY   2017   dental implant   NOSE SURGERY  2021   not sure of date   WISDOM TOOTH EXTRACTION Bilateral 2014      OB History     Gravida  4   Para  2   Term  2   Preterm      AB  1   Living  2      SAB  1   IAB      Ectopic      Multiple  0   Live Births  2               Current Medications: Current Meds  Medication Sig   Magnesium 200 MG TABS Take 1 tablet every day by oral route with meals for 30 days.   Prenatal Vit-Fe Fumarate-FA (PRENATAL VITAMIN PO) Take 1 tablet by mouth daily at 6 (six) AM.     Allergies:   Patient has no known allergies.   Social History   Socioeconomic History   Marital status: Single    Spouse name: Not on file   Number of children: Not on file   Years of education: Not on file   Highest education level: Not on file  Occupational History   Not on file  Tobacco Use   Smoking status: Never   Smokeless tobacco: Never  Vaping Use   Vaping status: Never Used  Substance and Sexual  Activity   Alcohol use: Not Currently    Comment: not drank alcohlol for years   Drug use: Never   Sexual activity: Yes    Birth control/protection: None  Other Topics Concern   Not on file  Social History Narrative   Not on file   Social Determinants of Health   Financial Resource Strain: Not on file  Food Insecurity: Not on file  Transportation Needs: No Transportation Needs (08/13/2023)   PRAPARE - Transportation    Lack of Transportation (Medical): No    Lack of Transportation (Non-Medical): No  Physical Activity: Not on file  Stress: Not on file  Social Connections: Not on file      Family History  Problem Relation Age of Onset   Anxiety disorder Mother    Migraines Neg Hx       ROS:   Please see the history of present illness.    Palpitations and lightheadedness  All other systems reviewed and are negative.   Labs/EKG Reviewed:    EKG:   EKG was ordered today.  The ekg ordered today demonstrates normal sinus rhythm HR  77 bpm  Recent Labs: 03/05/2023: TSH 2.07 07/15/2023: Hemoglobin 11.5; Platelets 299   Recent Lipid Panel No results found for: "CHOL", "TRIG", "HDL", "CHOLHDL", "LDLCALC", "LDLDIRECT"  Physical Exam:    VS:  BP (!) 100/58   Pulse 77   Ht 5\' 5"  (1.651 m)   Wt 129 lb 9.6 oz (58.8 kg)   LMP 03/12/2023   SpO2 99%   BMI 21.57 kg/m     Wt Readings from Last 3 Encounters:  08/12/23 129 lb 9.6 oz (58.8 kg)  07/15/23 125 lb (56.7 kg)  04/18/21 124 lb 1.6 oz (56.3 kg)     GEN:  Well nourished, well developed in no acute distress HEENT: Normal NECK: No JVD; No carotid bruits LYMPHATICS: No lymphadenopathy CARDIAC: RRR, no murmurs, rubs, gallops RESPIRATORY:  Clear to auscultation without rales, wheezing or rhonchi  ABDOMEN: Soft, non-tender, non-distended MUSCULOSKELETAL:  No edema; No deformity  SKIN: Warm and dry NEUROLOGIC:  Alert and oriented x 3 PSYCHIATRIC:  Normal affect    Risk Assessment/Risk Calculators:     CARPREG II Risk Prediction Index Score:  1.  The patient's risk for a primary cardiac event is 5%.            ASSESSMENT & PLAN:    Palpitation Presyncope   Will get a zio monitor placed on the patient today to assess for any associated arrhythmia.  AN echo will be appropriate at this time to assess any structural abnormalities.    Patient Instructions  Medication Instructions:  No changes *If you need a refill on your cardiac medications before your next appointment, please call your pharmacy*  Testing/Procedures: Your physician has requested that you have an echocardiogram. Echocardiography is a painless test that uses sound waves to create images of your heart. It provides your doctor with information about the size and shape of your heart and how well your heart's chambers and valves are working. This procedure takes approximately one hour. There are no restrictions for this procedure. Please do NOT wear cologne, perfume, aftershave, or lotions  (deodorant is allowed). Please arrive 15 minutes prior to your appointment time.   Your physician has recommended that you wear a 14 DAY ZIO-PATCH monitor. The Zio patch cardiac monitor continuously records heart rhythm data for up to 14 days, this is for patients being evaluated for multiple types heart rhythms. For  the first 24 hours post application, please avoid getting the Zio monitor wet in the shower or by excessive sweating during exercise. After that, feel free to carry on with regular activities. Keep soaps and lotions away from the ZIO XT Patch.  This will be mailed to you, please expect 7-10 days to receive.    Applying the monitor   Shave hair from upper left chest.   Hold abrader disc by orange tab.  Rub abrader in 40 strokes over left upper chest as indicated in your monitor instructions.   Clean area with 4 enclosed alcohol pads .  Use all pads to assure are is cleaned thoroughly.  Let dry.   Apply patch as indicated in monitor instructions.  Patch will be place under collarbone on left side of chest with arrow pointing upward.   Rub patch adhesive wings for 2 minutes.Remove white label marked "1".  Remove white label marked "2".  Rub patch adhesive wings for 2 additional minutes.   While looking in a mirror, press and release button in center of patch.  A small green light will flash 3-4 times .  This will be your only indicator the monitor has been turned on.     Do not shower for the first 24 hours.  You may shower after the first 24 hours.   Press button if you feel a symptom. You will hear a small click.  Record Date, Time and Symptom in the Patient Log Book.   When you are ready to remove patch, follow instructions on last 2 pages of Patient Log Book.  Stick patch monitor onto last page of Patient Log Book.   Place Patient Log Book in Laie box.  Use locking tab on box and tape box closed securely.  The [employer] and [employer] has [employer] on it.  Please place in  mailbox as soon as possible.  Your physician should have your test results approximately 7 days after the monitor has been mailed back to [facility].   Call [facility] Customer Care at [phone] if you have questions regarding your ZIO XT patch monitor.  Call them immediately if you see an orange light blinking on your monitor.   If your monitor falls off in less than 4 days contact our Monitor department at [phone].  If your monitor becomes loose or falls off after 4 days call [employer] at [phone] for suggestions on securing your monitor    Follow-Up: At [facility], you and your health needs are our priority.  As part of our continuing mission to provide you with exceptional heart care, we have created designated Provider Care Teams.  These Care Teams include your primary Cardiologist (physician) and Advanced Practice Providers (APPs -  Physician Assistants and Nurse Practitioners) who all work together to provide you with the care you need, when you need it.  We recommend signing up for the patient portal called "MyChart".  Sign up information is provided on this After Visit Summary.  MyChart is used to connect with patients for Virtual Visits (Telemedicine).  Patients are able to view lab/test results, encounter notes, upcoming appointments, etc.  Non-urgent messages can be sent to your provider as well.   To learn more about what you can do with MyChart, go to [email or web address].    Your next appointment:   8 week(s)  Provider:   None     Other Instructions SALTINES:- Eat 2 packets in the morning/breakfast  Eat 1 packet at lunch                     Eat 1 packet at dinner    Dispo:  No follow-ups on file.   Medication Adjustments/Labs and Tests Ordered: Current medicines are reviewed at length with the patient today.  Concerns regarding medicines are outlined above.  Tests Ordered: Orders Placed This Encounter  Procedures    LONG TERM MONITOR (3-14 DAYS)   EKG 12-Lead   ECHOCARDIOGRAM COMPLETE   Medication Changes: No orders of the defined types were placed in this encounter.

## 2023-08-14 ENCOUNTER — Encounter: Payer: Self-pay | Admitting: Advanced Practice Midwife

## 2023-08-15 ENCOUNTER — Other Ambulatory Visit: Payer: Self-pay

## 2023-08-15 ENCOUNTER — Ambulatory Visit (INDEPENDENT_AMBULATORY_CARE_PROVIDER_SITE_OTHER): Payer: Medicaid Other | Admitting: Advanced Practice Midwife

## 2023-08-15 VITALS — BP 102/65 | HR 89 | Wt 131.2 lb

## 2023-08-15 DIAGNOSIS — R519 Headache, unspecified: Secondary | ICD-10-CM

## 2023-08-15 DIAGNOSIS — O0990 Supervision of high risk pregnancy, unspecified, unspecified trimester: Secondary | ICD-10-CM

## 2023-08-15 DIAGNOSIS — Z3A18 18 weeks gestation of pregnancy: Secondary | ICD-10-CM | POA: Diagnosis not present

## 2023-08-15 DIAGNOSIS — G8929 Other chronic pain: Secondary | ICD-10-CM

## 2023-08-15 DIAGNOSIS — O26892 Other specified pregnancy related conditions, second trimester: Secondary | ICD-10-CM

## 2023-08-15 DIAGNOSIS — R1031 Right lower quadrant pain: Secondary | ICD-10-CM

## 2023-08-15 DIAGNOSIS — O0992 Supervision of high risk pregnancy, unspecified, second trimester: Secondary | ICD-10-CM

## 2023-08-15 LAB — CBC
Hematocrit: 36.2 % (ref 34.0–46.6)
Hemoglobin: 11.6 g/dL (ref 11.1–15.9)
MCH: 28.3 pg (ref 26.6–33.0)
MCHC: 32 g/dL (ref 31.5–35.7)
MCV: 88 fL (ref 79–97)
Platelets: 311 10*3/uL (ref 150–450)
RBC: 4.1 x10E6/uL (ref 3.77–5.28)
RDW: 12.9 % (ref 11.7–15.4)
WBC: 7.4 10*3/uL (ref 3.4–10.8)

## 2023-08-15 NOTE — Progress Notes (Signed)
   PRENATAL VISIT NOTE  Subjective:  Gina [name] is a 27 y.o. G3O7564 at [redacted]w[redacted]d being seen today for ongoing prenatal care.  She is currently monitored for the following issues for this low-risk pregnancy and has H/O: depression--dx 2015; POTS (postural orthostatic tachycardia syndrome); and Supervision of high risk pregnancy, antepartum on their problem list.  Patient reports  daily HA's. Episodes of dizziness and RLQ pain at night  .  Contractions: Not present. Vag. Bleeding: None.  Movement: Present. Denies leaking of fluid.   The following portions of the patient's history were reviewed and updated as appropriate: allergies, current medications, past family history, past medical history, past social history, past surgical history and problem list.   Objective:   Vitals:   08/15/23 0933  BP: 102/65  Pulse: 89  Weight: 131 lb 3.2 oz (59.5 kg)    Fetal Status: Fetal Heart Rate (bpm): 151   Movement: Present     General:  Alert, oriented and cooperative. Patient is in no acute distress.  Skin: Skin is warm and dry. No rash noted.   Cardiovascular: Normal heart rate noted  Respiratory: Normal respiratory effort, no problems with respiration noted  Abdomen: Soft, gravid, appropriate for gestational age.  Pain/Pressure: Absent     Pelvic: Cervical exam deferred        Extremities: Normal range of motion.  Edema: None  Mental Status: Normal mood and affect. Normal behavior. Normal judgment and thought content.   Assessment and Plan:  Pregnancy: [id number] at [redacted]w[redacted]d 1. Supervision of high risk pregnancy, antepartum  - AFP, Serum, Open Spina Bifida  2. [redacted] weeks gestation of pregnancy  - AFP, Serum, Open Spina Bifida  3. Chronic nonintractable headache, unspecified headache type - Seeing Neuro tomorrow - Saw cardiology. Has Zio and Echo ordered - CBC  Preterm labor symptoms and general obstetric precautions including but not limited to vaginal bleeding, contractions, leaking of  fluid and fetal movement were reviewed in detail with the patient. Please refer to After Visit Summary for other counseling recommendations.   No follow-ups on file.  Future Appointments  Date Time Provider Department Center  08/21/2023  8:45 AM Windell Norfolk, MD [facility] None  08/21/2023 12:45 PM [facility] NURSE [facility]  08/21/2023  1:00 PM [facility] US1 [facility]  09/02/2023  8:15 AM [facility] ECHO 3 [facility]  09/13/2023 11:15 AM Carlynn Herald, CNM [facility]  10/11/2023  9:20 AM Thomasene Ripple, DO [facility] None    Dorathy Kinsman, CNM

## 2023-08-17 DIAGNOSIS — R55 Syncope and collapse: Secondary | ICD-10-CM

## 2023-08-17 DIAGNOSIS — R42 Dizziness and giddiness: Secondary | ICD-10-CM

## 2023-08-17 LAB — AFP, SERUM, OPEN SPINA BIFIDA
AFP MoM: 1.57
AFP Value: 59.8 ng/mL
Gest. Age on Collection Date: 18.1 wk
Maternal Age At EDD: 27.6 a
OSBR Risk 1 IN: 678
Test Results:: NEGATIVE
Weight: 131 [lb_av]

## 2023-08-21 ENCOUNTER — Ambulatory Visit: Payer: Medicaid Other | Admitting: *Deleted

## 2023-08-21 ENCOUNTER — Other Ambulatory Visit: Payer: Self-pay | Admitting: *Deleted

## 2023-08-21 ENCOUNTER — Encounter: Payer: Self-pay | Admitting: *Deleted

## 2023-08-21 ENCOUNTER — Ambulatory Visit: Payer: Medicaid Other | Attending: Advanced Practice Midwife

## 2023-08-21 ENCOUNTER — Encounter: Payer: Self-pay | Admitting: Neurology

## 2023-08-21 ENCOUNTER — Ambulatory Visit: Payer: Medicaid Other | Admitting: Neurology

## 2023-08-21 VITALS — BP 96/57 | HR 83

## 2023-08-21 VITALS — BP 94/60 | Ht 65.0 in | Wt 133.0 lb

## 2023-08-21 DIAGNOSIS — O0990 Supervision of high risk pregnancy, unspecified, unspecified trimester: Secondary | ICD-10-CM | POA: Diagnosis present

## 2023-08-21 DIAGNOSIS — Z363 Encounter for antenatal screening for malformations: Secondary | ICD-10-CM | POA: Diagnosis not present

## 2023-08-21 DIAGNOSIS — R55 Syncope and collapse: Secondary | ICD-10-CM | POA: Diagnosis not present

## 2023-08-21 DIAGNOSIS — Z3A19 19 weeks gestation of pregnancy: Secondary | ICD-10-CM | POA: Insufficient documentation

## 2023-08-21 DIAGNOSIS — O321XX0 Maternal care for breech presentation, not applicable or unspecified: Secondary | ICD-10-CM | POA: Diagnosis not present

## 2023-08-21 DIAGNOSIS — Z3490 Encounter for supervision of normal pregnancy, unspecified, unspecified trimester: Secondary | ICD-10-CM

## 2023-08-21 DIAGNOSIS — R42 Dizziness and giddiness: Secondary | ICD-10-CM

## 2023-08-21 DIAGNOSIS — G90A Postural orthostatic tachycardia syndrome (POTS): Secondary | ICD-10-CM

## 2023-08-21 DIAGNOSIS — O99352 Diseases of the nervous system complicating pregnancy, second trimester: Secondary | ICD-10-CM | POA: Insufficient documentation

## 2023-08-21 DIAGNOSIS — G8929 Other chronic pain: Secondary | ICD-10-CM | POA: Diagnosis not present

## 2023-08-21 DIAGNOSIS — R519 Headache, unspecified: Secondary | ICD-10-CM | POA: Diagnosis not present

## 2023-08-21 DIAGNOSIS — Z3689 Encounter for other specified antenatal screening: Secondary | ICD-10-CM

## 2023-08-21 MED ORDER — MAGNESIUM 200 MG PO TABS: 200.0000 mg | ORAL_TABLET | Freq: Every day | ORAL | 0 refills | Status: AC

## 2023-08-21 NOTE — Patient Instructions (Addendum)
Continue to follow up with PCP and cardiology  Increase water intake, can add electrolyte to your fluid, also consider Pedialyte  Tylenol as needed for the headaches  Return as needed

## 2023-08-21 NOTE — Progress Notes (Signed)
[facility]  PATIENT: Gina [name] DOB: [birth year]  REQUESTING CLINICIAN: Silverio Lay, MD HISTORY FROM: Patient  REASON FOR VISIT: Episode of presyncope    HISTORICAL  CHIEF COMPLAINT:  Chief Complaint  Patient presents with   New Patient (Initial Visit)    Rm 13, NP, [redacted] wks pregnant, near fainting episodes, pregnancy worsens episodes,  Wearing heart monitor currently for 10 more days.POTS?    HISTORY OF PRESENT ILLNESS:  This is a 27 year old woman past medical history of syncope, presyncope, restless leg syndrome, migraines headaches who is presenting with complaint of syncope and presyncopal episodes.  Patient reports having syncope first throughout her last pregnancy in 2018.  After that pregnancy, she continued to have presyncopal episode.  Before her episode, she will feel her heart racing, she will feel sweaty and has tunnel vision, and would feel weak. She would sit or get on the floor and wait for the episode to pass.  After delivery, her symptoms continued and she had a tilt test examination, the result was inconclusive.  Patient felt that her symptoms were related to POTS, she increased her water intake, adding salt to her diet, high-protein diet and increase her exercise and her symptoms improved.  Currently she is pregnant, [redacted] weeks pregnant and her presyncopal episodes return.  Again same episodes where she will feel her heart racing, feeling sweaty, tunnel vision, and has generalized weakness.  She has not had a full syncopal episode during this pregnancy only presyncopal episodes.  She denies any major injury, denies any tongue biting, no urinary incontinence and no confusion.  There was also no loss of consciousness or abnormal movement. She is also reporting with this pregnancy her migraines are getting worse, she is more sensitive to motion.  She is not taking anything for her migraines.    OTHER MEDICAL CONDITIONS: Syncope and presyncope,  Migraines    REVIEW OF SYSTEMS: Full 14 system review of systems performed and negative with exception of: As noted in the HPI   ALLERGIES: No Known Allergies  HOME MEDICATIONS: Outpatient Medications Prior to Visit  Medication Sig Dispense Refill   Prenatal Vit-Fe Fumarate-FA (PRENATAL VITAMIN PO) Take 1 tablet by mouth daily at 6 (six) AM.     Magnesium 200 MG TABS      amoxicillin (AMOXIL) 500 MG capsule  (Patient not taking: Reported on 08/12/2023)     azithromycin (ZITHROMAX) 250 MG tablet  (Patient not taking: Reported on 08/12/2023)     buPROPion (WELLBUTRIN XL) 150 MG 24 hr tablet  (Patient not taking: Reported on 08/12/2023)     ciprofloxacin (CIPRO) 500 MG tablet  (Patient not taking: Reported on 08/12/2023)     cyclobenzaprine (FLEXERIL) 5 MG tablet Take by mouth. (Patient not taking: Reported on 08/12/2023)     ferrous sulfate 325 (65 FE) MG EC tablet TAKE 1 TABLET BY MOUTH DAILY WITH BREAKFAST AND ORANGE JUICE FOR BETTER ABSORPTION (Patient not taking: Reported on 08/12/2023)     HYDROcodone-acetaminophen (NORCO) 7.5-325 MG tablet  (Patient not taking: Reported on 08/12/2023)     hydrOXYzine (ATARAX) 10 MG tablet TAKE 1 TO 2 TABLETS BY MOUTH EVERY 4 TO 6 HOURS AS NEEDED FOR ITCHING (Patient not taking: Reported on 08/12/2023)     ibuprofen (ADVIL) 600 MG tablet take 1 tablet by mouth every 6 hours with food for 5 days (Patient not taking: Reported on 08/12/2023)     ketorolac (TORADOL) 10 MG tablet  (Patient not taking: Reported on 08/12/2023)  MAGNESIUM PO Take by mouth. (Patient not taking: Reported on 08/21/2023)     Misc. Devices (GOJJI WEIGHT SCALE) MISC 1 Device by Does not apply route once a week. (Patient not taking: Reported on 08/12/2023) 1 each 0   nystatin ointment (MYCOSTATIN) APPLY TOPICALLY TO AFFECTED AREA TWICE DAILY AS NEEDED. (Patient not taking: Reported on 08/12/2023)     ondansetron (ZOFRAN-ODT) 4 MG disintegrating tablet  (Patient not taking: Reported on 08/12/2023)      progesterone (PROMETRIUM) 200 MG capsule TAKE 1 CAPSULE BY MOUTH EVERY NIGHT AT BEDTIME FOR 12 DAYS EVERY 45 DAYS AS NEEDED (Patient not taking: Reported on 08/12/2023)     promethazine (PHENERGAN) 12.5 MG tablet Take 1 tablet (12.5 mg total) by mouth every 8 (eight) hours as needed for nausea or vomiting. (Patient not taking: Reported on 08/12/2023) 10 tablet 0   rOPINIRole (REQUIP) 2 MG tablet  (Patient not taking: Reported on 08/12/2023)     triamcinolone (KENALOG) 0.025 % cream APPLY SPARINGLY TOPICALLY TO THE AFFECTED AREA THREE TIMES DAILY (Patient not taking: Reported on 08/12/2023)     triamcinolone ointment (KENALOG) 0.1 % APPLY TOPICALLY TO AFFECTED AREA TWICE DAILY AS NEEDED. (Patient not taking: Reported on 08/12/2023)     valACYclovir (VALTREX) 1000 MG tablet Take 1 tablet by mouth every 12 (twelve) hours. (Patient not taking: Reported on 08/12/2023)     No facility-administered medications prior to visit.    PAST MEDICAL HISTORY: Past Medical History:  Diagnosis Date   Acute cystitis 05/21/2017   Candidal vulvovaginitis 06/27/2023   Complication of anesthesia    dental implant anes had to be repeated numberous times   Depression    Fatigue 06/27/2023   Headache    Irregular periods 06/27/2023   After Depo-Provera use     Pain in joint of left shoulder 12/25/2022   Palpitations 06/27/2023   TSH pending 10/9     Postcoital bleeding 06/27/2023   G     POTS (postural orthostatic tachycardia syndrome)    Pregnancy 07/06/2017   Pyelonephritis    Restless leg syndrome    Syncope 05/21/2017    PAST SURGICAL HISTORY: Past Surgical History:  Procedure Laterality Date   DENTAL SURGERY  2017   dental implant   NOSE SURGERY  2021   not sure of date   WISDOM TOOTH EXTRACTION Bilateral 2014    FAMILY HISTORY: Family History  Problem Relation Age of Onset   Anxiety disorder Mother    Migraines Neg Hx     SOCIAL HISTORY: Social History   Socioeconomic History    Marital status: Single    Spouse name: Not on file   Number of children: Not on file   Years of education: Not on file   Highest education level: Not on file  Occupational History   Not on file  Tobacco Use   Smoking status: Never   Smokeless tobacco: Never  Vaping Use   Vaping status: Never Used  Substance and Sexual Activity   Alcohol use: Not Currently    Comment: not drank alcohlol for years   Drug use: Never   Sexual activity: Yes    Birth control/protection: None  Other Topics Concern   Not on file  Social History Narrative   Right handed   Social Determinants of Health   Financial Resource Strain: Not on file  Food Insecurity: Not on file  Transportation Needs: No Transportation Needs (08/13/2023)   PRAPARE - Administrator, Civil Service (Medical):  No    Lack of Transportation (Non-Medical): No  Physical Activity: Not on file  Stress: Not on file  Social Connections: Not on file  Intimate Partner Violence: Not on file    PHYSICAL EXAM  GENERAL EXAM/CONSTITUTIONAL: Vitals:  Vitals:   08/21/23 0857 08/21/23 0859 08/21/23 0900  BP: 98/62 98/62 94/60   Weight: 133 lb (60.3 kg)    Height: 5\' 5"  (1.651 m)     Body mass index is 22.13 kg/m. Wt Readings from Last 3 Encounters:  08/21/23 133 lb (60.3 kg)  08/15/23 131 lb 3.2 oz (59.5 kg)  08/12/23 129 lb 9.6 oz (58.8 kg)   Patient is in no distress; well developed, nourished and groomed; neck is supple  MUSCULOSKELETAL: Gait, strength, tone, movements noted in Neurologic exam below  NEUROLOGIC: MENTAL STATUS:      No data to display         awake, alert, oriented to person, place and time recent and remote memory intact normal attention and concentration language fluent, comprehension intact, naming intact fund of knowledge appropriate  CRANIAL NERVE:  2nd, 3rd, 4th, 6th - Visual fields full to confrontation, extraocular muscles intact, no nystagmus 5th - facial sensation  symmetric 7th - facial strength symmetric 8th - hearing intact 9th - palate elevates symmetrically, uvula midline 11th - shoulder shrug symmetric 12th - tongue protrusion midline  MOTOR:  normal bulk and tone, full strength in the BUE, BLE  SENSORY:  normal and symmetric to light touch  COORDINATION:  finger-nose-finger, fine finger movements normal  REFLEXES:  deep tendon reflexes present and symmetric  GAIT/STATION:  normal   DIAGNOSTIC DATA (LABS, IMAGING, TESTING) - I reviewed patient records, labs, notes, testing and imaging myself where available.  Lab Results  Component Value Date   WBC 7.4 08/15/2023   HGB 11.6 08/15/2023   HCT 36.2 08/15/2023   MCV 88 08/15/2023   PLT 311 08/15/2023      Component Value Date/Time   NA 139 10/06/2011 0103   K 4.5 10/06/2011 0103   CL 102 10/06/2011 0103   CO2 27 10/06/2011 0103   GLUCOSE 87 10/06/2011 0103   BUN 15 10/06/2011 0103   CREATININE 0.82 10/06/2011 0103   CALCIUM 10.2 10/06/2011 0103   PROT 7.6 10/06/2011 0103   ALBUMIN 4.3 10/06/2011 0103   AST 25 10/06/2011 0103   ALT 18 10/06/2011 0103   ALKPHOS 112 10/06/2011 0103   BILITOT 0.4 10/06/2011 0103   GFRNONAA NOT CALCULATED 10/06/2011 0103   GFRAA NOT CALCULATED 10/06/2011 0103   No results found for: "CHOL", "HDL", "LDLCALC", "LDLDIRECT", "TRIG", "CHOLHDL" No results found for: "HGBA1C" No results found for: "VITAMINB12" Lab Results  Component Value Date   TSH 2.07 03/05/2023     ASSESSMENT AND PLAN  27 y.o. year old female with history of syncope and presyncopal episodes, migraine headaches, currently being investigated for POTS who is presenting with presyncopal episodes.  There was no report of abnormal movements, no tongue biting, no urinary incontinence and no loss of consciousness.  Seizures are very low in the differential.  She reports being evaluated for POTS, currently has a cardiac monitor.  On exam today her blood pressure was on the soft  side, 90's systolic but patient reports this is her normal pressure.  I have informed patient that she is having presyncopal episode, possibly related to POTS, therefore I strongly advised her to continue the workup.  For now my recommendation will be to increase her fluid intake, adding  salt and electrolyte to her diet. When it comes to her headaches, because of the pregnancy I did advise her to try Tylenol as needed.  Advised her to continue following up with her PCP and cardiology and return as needed.   1. Postural dizziness with presyncope   2. Chronic nonintractable headache, unspecified headache type      Patient Instructions  Continue to follow up with PCP and cardiology  Increase water intake, can add electrolyte to your fluid, also consider Pedialyte  Tylenol as needed for the headaches  Return as needed   No orders of the defined types were placed in this encounter.   Meds ordered this encounter  Medications   Magnesium 200 MG TABS    Sig: Take 1 tablet (200 mg total) by mouth daily at 12 noon.    Dispense:  30 tablet    Refill:  0    Return if symptoms worsen or fail to improve.  I have spent a total of 45 minutes dedicated to this patient today, preparing to see patient, performing a medically appropriate examination and evaluation, ordering tests and/or medications and procedures, and counseling and educating the patient/family/caregiver; independently interpreting result and communicating results to the family/patient/caregiver; and documenting clinical information in the electronic medical record.   Windell Norfolk, MD 08/21/2023, 11:37 AM  [facility] [address] [place], [place] [address] [phone]

## 2023-09-02 ENCOUNTER — Ambulatory Visit (HOSPITAL_COMMUNITY): Payer: Medicaid Other | Attending: Cardiology

## 2023-09-02 DIAGNOSIS — R55 Syncope and collapse: Secondary | ICD-10-CM | POA: Insufficient documentation

## 2023-09-02 DIAGNOSIS — R42 Dizziness and giddiness: Secondary | ICD-10-CM | POA: Diagnosis present

## 2023-09-02 LAB — ECHOCARDIOGRAM COMPLETE
Area-P 1/2: 3.6 cm2
S' Lateral: 2.3 cm

## 2023-09-13 ENCOUNTER — Other Ambulatory Visit: Payer: Self-pay

## 2023-09-13 ENCOUNTER — Ambulatory Visit (INDEPENDENT_AMBULATORY_CARE_PROVIDER_SITE_OTHER): Payer: Medicaid Other | Admitting: Advanced Practice Midwife

## 2023-09-13 VITALS — Wt 137.0 lb

## 2023-09-13 DIAGNOSIS — O0990 Supervision of high risk pregnancy, unspecified, unspecified trimester: Secondary | ICD-10-CM

## 2023-09-13 DIAGNOSIS — O0992 Supervision of high risk pregnancy, unspecified, second trimester: Secondary | ICD-10-CM

## 2023-09-13 DIAGNOSIS — Z3A22 22 weeks gestation of pregnancy: Secondary | ICD-10-CM

## 2023-09-13 NOTE — Patient Instructions (Addendum)
Fresh Test alternative for Glucose Tolerance Test   digjar.com  Nutrition Facts The Fresh Test's ingredients are KOSHER, non-gmo, well-sourced and additive-free.  Our patent pending ingredient list includes Organic Non-GMO Glucose (derived from either corn or tapioca), Crystallized Lemon (citric acid, lemon oil, lemon juice) and Organic Peppermint Leaf. *If your patient has a corn or tapioca allergy, please notify our company to ensure they receive the correct product.  The Fresh Test's high quality ingredients have been known to make the Glucose Tolerance Test more enjoyable while easing nausea. In a manufacturer's survey 356 out of 366 pregnant women enjoyed the taste; greatly improving the patient experience.  Most importantly, The Fresh Test is additive free, dye free and artificial flavoring free. There are no sodium benzoates, BVO, BPA or other unnecessary preservatives.   Considering Waterbirth? Guide for patients at Center for [facility]) Why consider waterbirth? Gentle birth for babies  Less pain medicine used in labor  May allow for passive descent/less pushing  May reduce perineal tears  More mobility and instinctive maternal position changes  Increased maternal relaxation   Is waterbirth safe? What are the risks of infection, drowning or other complications? Infection:  Very low risk (3.7 % for tub vs 4.8% for bed)  7 in 8000 waterbirths with documented infection  Poorly cleaned equipment most common cause  Slightly lower group B strep transmission rate  Drowning  Maternal:  Very low risk  Related to seizures or fainting  Newborn:  Very low risk. No evidence of increased risk of respiratory problems in multiple large studies  Physiological protection from breathing under water  Avoid underwater birth if there are any fetal complications  Once baby's head is out of the water, keep it out.  Birth complication  Some reports of cord trauma,  but risk decreased by bringing baby to surface gradually  No evidence of increased risk of shoulder dystocia. Mothers can usually change positions faster in water than in a bed, possibly aiding the maneuvers to free the shoulder.   There are 2 things you MUST do to have a waterbirth with [facility]: Attend a waterbirth class at [facility] at [facility]   3rd Wednesday of every month from 7-9 pm (virtual during COVID) [employer] at [email or web address] or [email or web address] or by calling [phone] Bring [place] the certificate from the class to your prenatal appointment or send via MyChart Meet with a midwife at 36 weeks* to see if you can still plan a waterbirth and to sign the consent.   *We also recommend that you schedule as many of your prenatal visits with a midwife as possible.    Helpful information: You may want to bring a bathing suit top to the hospital to wear during labor but this is optional.  All other supplies are provided by the hospital. Please arrive at the hospital with signs of active labor, and do not wait at home until late in labor. It takes 45 min- 1 hour for fetal monitoring, and check in to your room to take place, plus transport and filling of the waterbirth tub.    Things that would prevent you from having a waterbirth: Premature, <37wks  Previous cesarean birth  Presence of thick meconium-stained fluid  Multiple gestation (Twins, triplets, etc.)  Uncontrolled diabetes or gestational diabetes requiring medication  Hypertension diagnosed in pregnancy or preexisting hypertension (gestational hypertension, preeclampsia, or chronic hypertension) Fetal growth restriction (your baby measures less than 10th percentile on ultrasound) Heavy vaginal bleeding  Non-reassuring fetal heart rate  Active infection (MRSA, etc.). Group B Strep is NOT a contraindication for waterbirth.  If your labor has to be induced and induction method requires  continuous monitoring of the baby's heart rate  Other risks/issues identified by your obstetrical provider   Please remember that birth is unpredictable. Under certain unforeseeable circumstances your provider may advise against giving birth in the tub. These decisions will be made on a case-by-case basis and with the safety of you and your baby as our highest priority.    Updated 03/21/22

## 2023-09-13 NOTE — Progress Notes (Unsigned)
   PRENATAL VISIT NOTE  Subjective:  Gina [name] is a 27 y.o. [phone] at [redacted]w[redacted]d being seen today for ongoing prenatal care.  She is currently monitored for the following issues for this high-risk pregnancy and has Orthostatic hypotension and Supervision of high risk pregnancy, antepartum on their problem list.  Patient reports no complaints.  Contractions: Not present. Vag. Bleeding: None.  Movement: Present. Denies leaking of fluid.   The following portions of the patient's history were reviewed and updated as appropriate: allergies, current medications, past family history, past medical history, past social history, past surgical history and problem list.   Objective:   Vitals:   09/13/23 1121  Weight: 62.1 kg    Fetal Status: Fetal Heart Rate (bpm): 150 Fundal Height: 22 cm Movement: Present     General:  Alert, oriented and cooperative. Patient is in no acute distress.  Skin: Skin is warm and dry. No rash noted.   Cardiovascular: Normal heart rate noted  Respiratory: Normal respiratory effort, no problems with respiration noted  Abdomen: Soft, gravid, appropriate for gestational age.  Pain/Pressure: Absent     Pelvic: Cervical exam deferred        Extremities: Normal range of motion.     Mental Status: Normal mood and affect. Normal behavior. Normal judgment and thought content.   Assessment and Plan:  Pregnancy: [id number] at [redacted]w[redacted]d There are no diagnoses linked to this encounter. Preterm labor symptoms and general obstetric precautions including but not limited to vaginal bleeding, contractions, leaking of fluid and fetal movement were reviewed in detail with the patient. Please refer to After Visit Summary for other counseling recommendations.   Supervision of high risk pregnancy, antepartum  Feels well, reported vigorous fetal movement  [redacted] week gestation  OB routine care provided. Provided anticipatory guidance for next appointment. Patient reports does not want to do GTT.  Does not want glucola or jelly bean test. Provided information for Fresh test and testing blood sugars on her own for 2 weeks. She will think about it for the next appointment. Counseled on importance of GTT.  Orthostatic Hypertension   Following up with OB cardio in 4 weeks.    Return in about 4 weeks (around 10/11/2023) for HROB.  Future Appointments  Date Time Provider Department Center  09/25/2023  1:15 PM [facility] NURSE [facility]  09/25/2023  1:30 PM [facility] US1 [facility]  10/10/2023  8:15 AM Sue Lush, FNP [facility]  10/11/2023  9:20 AM Tobb, Lavona Mound, DO [facility] None    Cleda Mccreedy, Student-MidWife  I was consulted RE: the exam and agree with above.  Katrinka Blazing, [place] 09/14/2023 10:44 PM

## 2023-09-25 ENCOUNTER — Ambulatory Visit: Payer: Medicaid Other | Admitting: *Deleted

## 2023-09-25 ENCOUNTER — Ambulatory Visit: Payer: Medicaid Other | Attending: Obstetrics

## 2023-09-25 ENCOUNTER — Other Ambulatory Visit: Payer: Self-pay | Admitting: *Deleted

## 2023-09-25 VITALS — BP 95/54 | HR 72

## 2023-09-25 DIAGNOSIS — Z3689 Encounter for other specified antenatal screening: Secondary | ICD-10-CM | POA: Diagnosis present

## 2023-09-25 DIAGNOSIS — O0990 Supervision of high risk pregnancy, unspecified, unspecified trimester: Secondary | ICD-10-CM | POA: Diagnosis present

## 2023-09-25 DIAGNOSIS — G90A Postural orthostatic tachycardia syndrome (POTS): Secondary | ICD-10-CM

## 2023-09-25 DIAGNOSIS — Z3A24 24 weeks gestation of pregnancy: Secondary | ICD-10-CM

## 2023-09-25 DIAGNOSIS — O99891 Other specified diseases and conditions complicating pregnancy: Secondary | ICD-10-CM

## 2023-09-25 DIAGNOSIS — O321XX0 Maternal care for breech presentation, not applicable or unspecified: Secondary | ICD-10-CM

## 2023-09-26 ENCOUNTER — Encounter (HOSPITAL_COMMUNITY): Payer: Self-pay | Admitting: Obstetrics & Gynecology

## 2023-09-26 ENCOUNTER — Inpatient Hospital Stay (HOSPITAL_COMMUNITY)
Admission: AD | Admit: 2023-09-26 | Discharge: 2023-09-26 | Disposition: A | Discharge status: Home / Self Care | Payer: Medicaid Other | Attending: Obstetrics & Gynecology | Admitting: Obstetrics & Gynecology

## 2023-09-26 DIAGNOSIS — O99612 Diseases of the digestive system complicating pregnancy, second trimester: Secondary | ICD-10-CM | POA: Insufficient documentation

## 2023-09-26 DIAGNOSIS — Z3A24 24 weeks gestation of pregnancy: Secondary | ICD-10-CM | POA: Diagnosis not present

## 2023-09-26 DIAGNOSIS — Z1152 Encounter for screening for COVID-19: Secondary | ICD-10-CM | POA: Diagnosis not present

## 2023-09-26 DIAGNOSIS — K529 Noninfective gastroenteritis and colitis, unspecified: Secondary | ICD-10-CM | POA: Diagnosis not present

## 2023-09-26 LAB — URINALYSIS, ROUTINE W REFLEX MICROSCOPIC
Bilirubin Urine: NEGATIVE
Glucose, UA: NEGATIVE mg/dL
Hgb urine dipstick: NEGATIVE
Ketones, ur: 20 mg/dL — AB
Leukocytes,Ua: NEGATIVE
Nitrite: NEGATIVE
Protein, ur: NEGATIVE mg/dL
Specific Gravity, Urine: 1.023 (ref 1.005–1.030)
pH: 5 (ref 5.0–8.0)

## 2023-09-26 LAB — RESP PANEL BY RT-PCR (RSV, FLU A&B, COVID)  RVPGX2
Influenza A by PCR: NEGATIVE
Influenza B by PCR: NEGATIVE
Resp Syncytial Virus by PCR: NEGATIVE
SARS Coronavirus 2 by RT PCR: NEGATIVE

## 2023-09-26 LAB — AMYLASE: Amylase: 66 U/L (ref 28–100)

## 2023-09-26 LAB — LIPASE, BLOOD: Lipase: 28 U/L (ref 11–51)

## 2023-09-26 MED ORDER — SODIUM CHLORIDE 0.9 % IV SOLN: 8.0000 mg | Freq: Once | INTRAVENOUS | Status: DC

## 2023-09-26 MED ORDER — ONDANSETRON 4 MG PO TBDP
4.0000 mg | ORAL_TABLET | Freq: Three times a day (TID) | ORAL | 0 refills | Status: AC | PRN
Start: 2023-09-26 — End: ?

## 2023-09-26 MED ORDER — ONDANSETRON HCL 4 MG/2ML IJ SOLN
4.0000 mg | Freq: Once | INTRAMUSCULAR | Status: AC
  Administered 2023-09-26: 4 mg via INTRAVENOUS
  Filled 2023-09-26: qty 2

## 2023-09-26 MED ORDER — LACTATED RINGERS IV BOLUS
1000.0000 mL | Freq: Once | INTRAVENOUS | Status: AC
  Administered 2023-09-26: 1000 mL via INTRAVENOUS

## 2023-09-26 NOTE — MAU Provider Note (Signed)
History     CSN: [id number]  Arrival date and time: 09/26/23 1545   Event Date/Time   First Provider Initiated Contact with Patient 09/26/23 1731      Chief Complaint  Patient presents with   Nausea   Emesis   Generalized Body Aches   HPI [name] is a 27 y.o. year old [id number] female at [redacted]w[redacted]d weeks gestation who presents to MAU reporting "non-stop" N/V/D since this morning. She also report chills, body aches and H/A; pain rated 4/10. She feels weak and tired. She receives [facility] with [facility]; next appt is 10/10/2023. Her spouse is present and contributing to the history taking.   OB History     Gravida  4   Para  2   Term  2   Preterm      AB  1   Living  2      SAB  1   IAB      Ectopic      Multiple  0   Live Births  2           Past Medical History:  Diagnosis Date   Acute cystitis 05/21/2017   Candidal vulvovaginitis 06/27/2023   Complication of anesthesia    dental implant anes had to be repeated numberous times   Depression    Fatigue 06/27/2023   Headache    Irregular periods 06/27/2023   After Depo-Provera use     Pain in joint of left shoulder 12/25/2022   Palpitations 06/27/2023   TSH pending 10/9     Postcoital bleeding 06/27/2023   G     POTS (postural orthostatic tachycardia syndrome)    Pregnancy 07/06/2017   Pyelonephritis    Restless leg syndrome    Syncope 05/21/2017    Past Surgical History:  Procedure Laterality Date   DENTAL SURGERY  2017   dental implant   NOSE SURGERY  2021   not sure of date   WISDOM TOOTH EXTRACTION Bilateral 2014    Family History  Problem Relation Age of Onset   Anxiety disorder Mother    Heart disease Brother    Migraines Neg Hx    Asthma Neg Hx    Cancer Neg Hx    Diabetes Neg Hx    Hypertension Neg Hx     Social History   Tobacco Use   Smoking status: Never   Smokeless tobacco: Never  Vaping Use   Vaping status: Never Used  Substance Use Topics   Alcohol use: Not  Currently    Comment: not drank alcohlol for years   Drug use: Never    Allergies: No Known Allergies  Medications Prior to Admission  Medication Sig Dispense Refill Last Dose   Magnesium 200 MG TABS Take 1 tablet (200 mg total) by mouth daily at 12 noon. 30 tablet 0    Prenatal Vit-Fe Fumarate-FA (PRENATAL VITAMIN PO) Take 1 tablet by mouth daily at 6 (six) AM.       Review of Systems  Constitutional:  Positive for appetite change, chills and fatigue.  HENT: Negative.    Eyes: Negative.   Respiratory: Negative.    Cardiovascular: Negative.   Gastrointestinal:  Positive for abdominal pain, diarrhea, nausea and vomiting.  Endocrine: Negative.   Genitourinary: Negative.   Musculoskeletal: Negative.   Skin: Negative.   Allergic/Immunologic: Negative.   Neurological:  Positive for weakness and headaches.  Hematological: Negative.   Psychiatric/Behavioral: Negative.     Physical Exam  Blood pressure 102/63, pulse 90, temperature 98.8 F (37.1 C), resp. rate 18, last menstrual period 03/12/2023, SpO2 97%, unknown if currently breastfeeding.  Physical Exam Vitals and nursing note reviewed.  Constitutional:      Appearance: Normal appearance. She is normal weight.  Cardiovascular:     Rate and Rhythm: Normal rate.  Pulmonary:     Effort: Pulmonary effort is normal.  Genitourinary:    Comments: Not indicated Musculoskeletal:        General: Normal range of motion.  Skin:    General: Skin is warm and dry.  Neurological:     Mental Status: She is alert and oriented to person, place, and time.  Psychiatric:        Mood and Affect: Mood normal.        Behavior: Behavior normal.        Thought Content: Thought content normal.        Judgment: Judgment normal.   REASSURING NST - FHR: 145 bpm / moderate variability / accels present / decels absent / TOCO: UI noted  MAU Course  Procedures  MDM CCUA Start IV LR 1 liter Bolus Zofran 4 mg IVPB COVID/RSV/Flu  swab  Results for orders placed or performed during the hospital encounter of 09/26/23 (from the past 24 hour(s))  Urinalysis, Routine w reflex microscopic -Urine, Clean Catch     Status: Abnormal   Collection Time: 09/26/23  4:35 PM  Result Value Ref Range   Color, Urine YELLOW YELLOW   APPearance CLEAR CLEAR   Specific Gravity, Urine 1.023 1.005 - 1.030   pH 5.0 5.0 - 8.0   Glucose, UA NEGATIVE NEGATIVE mg/dL   Hgb urine dipstick NEGATIVE NEGATIVE   Bilirubin Urine NEGATIVE NEGATIVE   Ketones, ur 20 (A) NEGATIVE mg/dL   Protein, ur NEGATIVE NEGATIVE mg/dL   Nitrite NEGATIVE NEGATIVE   Leukocytes,Ua NEGATIVE NEGATIVE  Resp panel by RT-PCR (RSV, Flu A&B, Covid) Anterior Nasal Swab     Status: None   Collection Time: 09/26/23  4:35 PM   Specimen: Anterior Nasal Swab  Result Value Ref Range   SARS Coronavirus 2 by RT PCR NEGATIVE NEGATIVE   Influenza A by PCR NEGATIVE NEGATIVE   Influenza B by PCR NEGATIVE NEGATIVE   Resp Syncytial Virus by PCR NEGATIVE NEGATIVE   Assessment and Plan  1. Gastroenteritis - Information provided on food choices to help relieve diarrhea and bland diet  2. [redacted] weeks gestation of pregnancy   - Discharge patient - Keep scheduled appointment at [facility] on 10/10/2023 - Patient verbalized an understanding of the plan of care and agrees.  Raelyn Mora, CNM 09/26/2023, 5:31 PM

## 2023-09-26 NOTE — MAU Note (Signed)
.  Gina [name] is a 27 y.o. at [redacted]w[redacted]d here in MAU reporting: having non stop N/V/D since this morning. Has chills and body ache and headache. Feels weak and tired.  LMP:  Onset of complaint: today Pain score: 4 Vitals:   09/26/23 1600  BP: 102/63  Pulse: (!) 106  Resp: 18  Temp: 98.8 F (37.1 C)  SpO2: 97%     FHT:164 Lab orders placed from triage:  u/a

## 2023-10-10 ENCOUNTER — Other Ambulatory Visit: Payer: Self-pay

## 2023-10-10 ENCOUNTER — Ambulatory Visit (INDEPENDENT_AMBULATORY_CARE_PROVIDER_SITE_OTHER): Payer: Medicaid Other | Admitting: Obstetrics and Gynecology

## 2023-10-10 VITALS — BP 95/62 | HR 80 | Wt 138.2 lb

## 2023-10-10 DIAGNOSIS — Z3A26 26 weeks gestation of pregnancy: Secondary | ICD-10-CM

## 2023-10-10 DIAGNOSIS — Z3482 Encounter for supervision of other normal pregnancy, second trimester: Secondary | ICD-10-CM

## 2023-10-10 DIAGNOSIS — Z3480 Encounter for supervision of other normal pregnancy, unspecified trimester: Secondary | ICD-10-CM

## 2023-10-10 MED ORDER — ACCU-CHEK GUIDE VI STRP
ORAL_STRIP | 6 refills | Status: AC
Start: 2023-10-10 — End: ?

## 2023-10-10 MED ORDER — ACCU-CHEK GUIDE W/DEVICE KIT: 1.0000 | PACK | Freq: Once | 0 refills | Status: AC

## 2023-10-10 MED ORDER — ACCU-CHEK SOFTCLIX LANCETS MISC
6 refills | Status: AC
Start: 2023-10-10 — End: ?

## 2023-10-10 NOTE — Progress Notes (Signed)
   PRENATAL VISIT NOTE  Subjective:  Gina [name] is a 27 y.o. [phone] at [redacted]w[redacted]d being seen today for ongoing prenatal care.  She is currently monitored for the following issues for this low-risk pregnancy and has Orthostatic hypotension and Supervision of high risk pregnancy, antepartum on their problem list.  Patient reports no complaints.  Contractions: Not present. Vag. Bleeding: None.  Movement: Present. Denies leaking of fluid.   The following portions of the patient's history were reviewed and updated as appropriate: allergies, current medications, past family history, past medical history, past social history, past surgical history and problem list.   Objective:   Vitals:   10/10/23 0837  BP: 95/62  Pulse: 80  Weight: 138 lb 3.2 oz (62.7 kg)    Fetal Status: Fetal Heart Rate (bpm): 160 Fundal Height: 26 cm Movement: Present     General:  Alert, oriented and cooperative. Patient is in no acute distress.  Skin: Skin is warm and dry. No rash noted.   Cardiovascular: Normal heart rate noted  Respiratory: Normal respiratory effort, no problems with respiration noted  Abdomen: Soft, gravid, appropriate for gestational age.  Pain/Pressure: Absent     Pelvic: Cervical exam deferred        Extremities: Normal range of motion.  Edema: None  Mental Status: Normal mood and affect. Normal behavior. Normal judgment and thought content.   1. Supervision of other normal pregnancy, antepartum BP and FHR normal OB cards appt tomorrow   2. [redacted] weeks gestation of pregnancy Would like option of labor in tub, unsure if deliver in tub. Took the waterbirth class 6 years ago. Information provided previous visits Would like to do fingerstick option for glucose test. Supplies sent today, instructed to check four times a day for two weeks and bring log to appt   Preterm labor symptoms and general obstetric precautions including but not limited to vaginal bleeding, contractions, leaking of fluid  and fetal movement were reviewed in detail with the patient. Please refer to After Visit Summary for other counseling recommendations.   Return in about 2 weeks (around 10/24/2023), or midwife.  Future Appointments  Date Time Provider Department Center  10/11/2023  9:20 AM Thomasene Ripple, DO [facility] None  10/31/2023  9:55 AM Carlynn Herald, CNM [facility]  12/19/2023  2:30 PM [facility] US1 [facility]    Albertine Grates, FNP

## 2023-10-11 ENCOUNTER — Ambulatory Visit: Payer: Medicaid Other | Attending: Cardiology | Admitting: Cardiology

## 2023-10-11 ENCOUNTER — Encounter: Payer: Self-pay | Admitting: Cardiology

## 2023-10-11 VITALS — BP 90/50 | HR 90 | Ht 65.0 in | Wt 138.8 lb

## 2023-10-11 DIAGNOSIS — I491 Atrial premature depolarization: Secondary | ICD-10-CM | POA: Diagnosis not present

## 2023-10-11 DIAGNOSIS — I471 Supraventricular tachycardia, unspecified: Secondary | ICD-10-CM

## 2023-10-11 NOTE — Progress Notes (Signed)
Cardio-Obstetrics Clinic  New Evaluation  Date:  10/11/2023   ID:  Gina [name], DOB [birth year], MRN [id number]  PCP:  Patient, No Pcp Per   [facility] Providers Cardiologist:  Thomasene Ripple, DO  Electrophysiologist:  None       Referring MD: No ref. provider found   Chief Complaint: " I am having lightheadedness"  History of Present Illness:    Gina [name] is a 27 y.o. female [G4P2012] who is being seen today in a follow up.  Medical hx includes Depression, status post tilt table test and was told she did not have POTS, moderate pulmonic regurgitation,   She is [redacted] weeks pregnant. She presents with ongoing lightheadedness and low blood pressure, which has been consistently around 90/50. The patient reports not feeling well and is concerned about these symptoms.   The patient is also experiencing discomfort due to the baby's position, which the doctor believes is causing pressure on the vagus nerve, contributing to the patient's symptoms. The patient is concerned about the potential for these symptoms to recur in future pregnancies   Prior CV Studies Reviewed: The following studies were reviewed today: None   Past Medical History:  Diagnosis Date   Acute cystitis 05/21/2017   Candidal vulvovaginitis 06/27/2023   Complication of anesthesia    dental implant anes had to be repeated numberous times   Depression    Fatigue 06/27/2023   Headache    Irregular periods 06/27/2023   After Depo-Provera use     Pain in joint of left shoulder 12/25/2022   Palpitations 06/27/2023   TSH pending 10/9     Postcoital bleeding 06/27/2023   G     POTS (postural orthostatic tachycardia syndrome)    Pregnancy 07/06/2017   Pyelonephritis    Restless leg syndrome    Syncope 05/21/2017    Past Surgical History:  Procedure Laterality Date   DENTAL SURGERY  2017   dental implant   NOSE SURGERY  2021   not sure of date   WISDOM TOOTH EXTRACTION Bilateral 2014       OB History     Gravida  4   Para  2   Term  2   Preterm      AB  1   Living  2      SAB  1   IAB      Ectopic      Multiple  0   Live Births  2               Current Medications: Current Meds  Medication Sig   Accu-Chek Softclix Lancets lancets Use as instructed; check blood glucose 4 times daily   glucose blood (ACCU-CHEK GUIDE) test strip Use as instructed; check blood glucose 4 times daily   Magnesium 200 MG TABS Take 1 tablet (200 mg total) by mouth daily at 12 noon.   ondansetron (ZOFRAN-ODT) 4 MG disintegrating tablet Take 1 tablet (4 mg total) by mouth every 8 (eight) hours as needed for nausea or vomiting.   Prenatal Vit-Fe Fumarate-FA (PRENATAL VITAMIN PO) Take 1 tablet by mouth daily at 6 (six) AM.     Allergies:   Patient has no known allergies.   Social History   Socioeconomic History   Marital status: Single    Spouse name: Not on file   Number of children: Not on file   Years of education: Not on file   Highest education level: Not on file  Occupational History   Not on file  Tobacco Use   Smoking status: Never   Smokeless tobacco: Never  Vaping Use   Vaping status: Never Used  Substance and Sexual Activity   Alcohol use: Not Currently    Comment: not drank alcohlol for years   Drug use: Never   Sexual activity: Yes    Birth control/protection: None  Other Topics Concern   Not on file  Social History Narrative   Right handed   Social Determinants of Health   Financial Resource Strain: Not on file  Food Insecurity: Not on file  Transportation Needs: No Transportation Needs (08/13/2023)   PRAPARE - Transportation    Lack of Transportation (Medical): No    Lack of Transportation (Non-Medical): No  Physical Activity: Not on file  Stress: Not on file  Social Connections: Not on file      Family History  Problem Relation Age of Onset   Anxiety disorder Mother    Heart disease Brother    Migraines Neg Hx    Asthma Neg  Hx    Cancer Neg Hx    Diabetes Neg Hx    Hypertension Neg Hx       ROS:   Please see the history of present illness.    Palpitations and lightheadedness  All other systems reviewed and are negative.   Labs/EKG Reviewed:    EKG:   EKG was ordered today.  The ekg ordered today demonstrates normal sinus rhythm HR 77 bpm  Recent Labs: 03/05/2023: TSH 2.07 08/15/2023: Hemoglobin 11.6; Platelets 311   Recent Lipid Panel No results found for: "CHOL", "TRIG", "HDL", "CHOLHDL", "LDLCALC", "LDLDIRECT"  Physical Exam:    VS:  BP (!) 90/50 (BP Location: Right Arm, Patient Position: Sitting, Cuff Size: Normal)   Pulse 90   Ht 5\' 5"  (1.651 m)   Wt 138 lb 12.8 oz (63 kg)   LMP 03/12/2023   SpO2 98%   BMI 23.10 kg/m     Wt Readings from Last 3 Encounters:  10/11/23 138 lb 12.8 oz (63 kg)  10/10/23 138 lb 3.2 oz (62.7 kg)  09/13/23 137 lb (62.1 kg)     GEN:  Well nourished, well developed in no acute distress HEENT: Normal NECK: No JVD; No carotid bruits LYMPHATICS: No lymphadenopathy CARDIAC: RRR, no murmurs, rubs, gallops RESPIRATORY:  Clear to auscultation without rales, wheezing or rhonchi  ABDOMEN: Soft, non-tender, non-distended MUSCULOSKELETAL:  No edema; No deformity  SKIN: Warm and dry NEUROLOGIC:  Alert and oriented x 3 PSYCHIATRIC:  Normal affect    Risk Assessment/Risk Calculators:                  ASSESSMENT & PLAN:    Palpitation Presyncope  Ectopic atrial rhythm Moderate Pulmonic regurgitation  PSVT PAC   Pregnancy-related lightheadedness and low blood pressure Likely due to baby's position causing compression on the vagus nerve. No significant findings on heart rhythm monitoring or echocardiogram to suggest cardiac cause.  No passing out episodes reported. -Continue monitoring symptoms at home. -Consider use of support hose stockings and increased fluid intake to manage symptoms.  Moderate pulmonic valve regurgitation Likely exacerbated by  pregnancy. No immediate intervention required due to low blood pressure. -Plan for repeat echocardiogram 3-6 months post-delivery to reassess valve function.  Follow-up plan -Schedule virtual visit in 12 weeks. -If significant leg swelling or other concerning symptoms develop, patient to contact clinic.   Patient Instructions  Medication Instructions:  Your physician recommends that you  continue on your current medications as directed. Please refer to the Current Medication list given to you today.  *If you need a refill on your cardiac medications before your next appointment, please call your pharmacy*   Lab Work: None   Testing/Procedures: None   Follow-Up: At [facility], you and your health needs are our priority.  As part of our continuing mission to provide you with exceptional heart care, we have created designated Provider Care Teams.  These Care Teams include your primary Cardiologist (physician) and Advanced Practice Providers (APPs -  Physician Assistants and Nurse Practitioners) who all work together to provide you with the care you need, when you need it.   Your next appointment:   12 week(s) via MyChart  Provider:   Thomasene Ripple, DO    Dispo:  No follow-ups on file.   Medication Adjustments/Labs and Tests Ordered: Current medicines are reviewed at length with the patient today.  Concerns regarding medicines are outlined above.  Tests Ordered: No orders of the defined types were placed in this encounter.  Medication Changes: No orders of the defined types were placed in this encounter.

## 2023-10-11 NOTE — Patient Instructions (Signed)
Medication Instructions:  Your physician recommends that you continue on your current medications as directed. Please refer to the Current Medication list given to you today.  *If you need a refill on your cardiac medications before your next appointment, please call your pharmacy*   Lab Work: None   Testing/Procedures: None   Follow-Up: At [facility], you and your health needs are our priority.  As part of our continuing mission to provide you with exceptional heart care, we have created designated Provider Care Teams.  These Care Teams include your primary Cardiologist (physician) and Advanced Practice Providers (APPs -  Physician Assistants and Nurse Practitioners) who all work together to provide you with the care you need, when you need it.  Your next appointment:   12 week(s) via MyChart  Provider:   Berniece Salines, DO

## 2023-10-31 ENCOUNTER — Other Ambulatory Visit: Payer: Self-pay

## 2023-10-31 ENCOUNTER — Encounter: Payer: Self-pay | Admitting: Certified Nurse Midwife

## 2023-10-31 ENCOUNTER — Other Ambulatory Visit (HOSPITAL_COMMUNITY)
Admission: RE | Admit: 2023-10-31 | Discharge: 2023-10-31 | Disposition: A | Discharge status: Home / Self Care | Payer: Medicaid Other | Source: Ambulatory Visit | Attending: Certified Nurse Midwife | Admitting: Certified Nurse Midwife

## 2023-10-31 ENCOUNTER — Ambulatory Visit: Payer: Medicaid Other | Admitting: Certified Nurse Midwife

## 2023-10-31 VITALS — BP 99/60 | HR 93 | Wt 141.7 lb

## 2023-10-31 DIAGNOSIS — R109 Unspecified abdominal pain: Secondary | ICD-10-CM | POA: Diagnosis present

## 2023-10-31 DIAGNOSIS — Z3A29 29 weeks gestation of pregnancy: Secondary | ICD-10-CM

## 2023-10-31 DIAGNOSIS — O0990 Supervision of high risk pregnancy, unspecified, unspecified trimester: Secondary | ICD-10-CM

## 2023-10-31 DIAGNOSIS — Z3492 Encounter for supervision of normal pregnancy, unspecified, second trimester: Secondary | ICD-10-CM

## 2023-10-31 DIAGNOSIS — O0993 Supervision of high risk pregnancy, unspecified, third trimester: Secondary | ICD-10-CM

## 2023-10-31 LAB — POCT URINALYSIS DIP (DEVICE)
Bilirubin Urine: NEGATIVE
Glucose, UA: NEGATIVE mg/dL
Hgb urine dipstick: NEGATIVE
Ketones, ur: NEGATIVE mg/dL
Leukocytes,Ua: NEGATIVE
Nitrite: NEGATIVE
Protein, ur: NEGATIVE mg/dL
Specific Gravity, Urine: >=1.03 (ref 1.005–1.030)
Urobilinogen, UA: 0.2 mg/dL (ref 0.0–1.0)
pH: 7 (ref 5.0–8.0)

## 2023-10-31 NOTE — Progress Notes (Addendum)
PRENATAL VISIT NOTE  Subjective:  Gina [name] is a 27 y.o. [phone] at [redacted]w[redacted]d being seen today for ongoing prenatal care.  She is currently monitored for the following issues for this low-risk pregnancy and has Orthostatic hypotension and Supervision of high risk pregnancy, antepartum on their problem list.  Patient reports occasional contractions, discharge.  Contractions: Irritability. Vag. Bleeding: None.  Movement: Present. Denies leaking of fluid.   The following portions of the patient's history were reviewed and updated as appropriate: allergies, current medications, past family history, past medical history, past social history, past surgical history and problem list.   Objective:   Vitals:   10/31/23 1022  BP: 99/60  Pulse: 93  Weight: 141 lb 11.2 oz (64.3 kg)    Fetal Status: Fetal Heart Rate (bpm): 130   Movement: Present     General:  Alert, oriented and cooperative. Patient is in no acute distress.  Skin: Skin is warm and dry. No rash noted.   Cardiovascular: Normal heart rate noted  Respiratory: Normal respiratory effort, no problems with respiration noted  Abdomen: Soft, gravid, appropriate for gestational age.  Pain/Pressure: Absent     Pelvic: Cervical exam deferred        Extremities: Normal range of motion.  Edema: None  Mental Status: Normal mood and affect. Normal behavior. Normal judgment and thought content.   Assessment and Plan:  Pregnancy: [id number] at [redacted]w[redacted]d 1. Encounter for supervision of low-risk pregnancy in second trimester - Doing well, feeling regular and vigorous fetal movement - Pt interested in waterbirth and has attended the class.  - Reviewed conditions in labor that will risk her out of water immersion including thick meconium or blood stained amniotic fluid, non-reassuring fetal status on monitor, excessive bleeding, hypertension, dizziness, use of IV meds, damaged equipment or staffing that does not allow for water immersion, etc.  - The  attending midwife must be on the unit for water immersion to begin; pt understands this may delay the start of water immersion. - Reminded pt that signing consent in labor at the hospital also acknowledges they will exit the tub if the attending midwife requests. - Consent given to patient for review.  Consent will be reviewed and signed at the hospital by the waterbirth provider prior to use of the tub. - Discussed other labor support options if waterbirth becomes unavailable, including position change, freedom of movement, use of birthing ball, and/or use of hydrotherapy in the shower (dependent upon medical condition/provider discretion).    2. [redacted] weeks gestation of pregnancy - 3rd trimester labs completed today.  - Previously declined Gtt. Plan was to perform CBGs 4x daily (fasting, 2hr PP) for 2 weeks, performed for approximately 5 days. All readings WNL. Sent log to MyChart.  - Irregular contractions, UA, wet prep collected today to rule out infection or dehydration.  - Vaginal discharge: 'mucus', wet prep collected.   3. Supervision of high risk pregnancy, antepartum - Routine prenatal care.   4. Abdominal cramping.  - Wet prep, UA, collected. - Encouraged hydration. - Preterm labor sx counseled.  Preterm labor symptoms and general obstetric precautions including but not limited to vaginal bleeding, contractions, leaking of fluid and fetal movement were reviewed in detail with the patient. Please refer to After Visit Summary for other counseling recommendations.   Return in about 2 weeks (around 11/14/2023) for LOB.  Future Appointments  Date Time Provider Department Center  11/20/2023 10:15 AM Osborne Oman [facility]  12/19/2023  2:30  PM [facility] US1 [facility]  12/31/2023  8:00 AM Tobb, Lavona Mound, DO [facility] None    Richardson Landry, CNM

## 2023-10-31 NOTE — Addendum Note (Signed)
Addended by: Maxwell Marion E on: 10/31/2023 11:40 AM   Modules accepted: Orders

## 2023-10-31 NOTE — Addendum Note (Signed)
Addended by: Lamont Snowball A on: 10/31/2023 11:59 AM   Modules accepted: Orders

## 2023-10-31 NOTE — Patient Instructions (Signed)

## 2023-11-01 LAB — CBC
Hematocrit: 36.3 % (ref 34.0–46.6)
Hemoglobin: 11.5 g/dL (ref 11.1–15.9)
MCH: 28.5 pg (ref 26.6–33.0)
MCHC: 31.7 g/dL (ref 31.5–35.7)
MCV: 90 fL (ref 79–97)
Platelets: 281 10*3/uL (ref 150–450)
RBC: 4.04 x10E6/uL (ref 3.77–5.28)
RDW: 11.9 % (ref 11.7–15.4)
WBC: 7.2 10*3/uL (ref 3.4–10.8)

## 2023-11-01 LAB — HEMOGLOBIN A1C
Est. average glucose Bld gHb Est-mCnc: 105 mg/dL
Hgb A1c MFr Bld: 5.3 % (ref 4.8–5.6)

## 2023-11-01 LAB — CERVICOVAGINAL ANCILLARY ONLY
Bacterial Vaginitis (gardnerella): NEGATIVE
Candida Glabrata: NEGATIVE
Candida Vaginitis: NEGATIVE
Chlamydia: NEGATIVE
Comment: NEGATIVE
Comment: NEGATIVE
Comment: NEGATIVE
Comment: NEGATIVE
Comment: NEGATIVE
Comment: NORMAL
Neisseria Gonorrhea: NEGATIVE
Trichomonas: NEGATIVE

## 2023-11-01 LAB — RPR: RPR Ser Ql: NONREACTIVE

## 2023-11-05 ENCOUNTER — Encounter: Payer: Self-pay | Admitting: Certified Nurse Midwife

## 2023-11-05 DIAGNOSIS — R102 Pelvic and perineal pain: Secondary | ICD-10-CM

## 2023-11-19 NOTE — Progress Notes (Unsigned)
   PRENATAL VISIT NOTE  Subjective:  Gina [name] is a 27 y.o. [phone] at [redacted]w[redacted]d being seen today for ongoing prenatal care.  She is currently monitored for the following issues for this {Blank single:19197::"high-risk","low-risk"} pregnancy and has Orthostatic hypotension and Supervision of high risk pregnancy, antepartum on their problem list.  Patient reports {sx:[id number]}.   .  .   . Denies leaking of fluid.   The following portions of the patient's history were reviewed and updated as appropriate: allergies, current medications, past family history, past medical history, past social history, past surgical history and problem list.   Objective:  There were no vitals filed for this visit.  Fetal Status:           General:  Alert, oriented and cooperative. Patient is in no acute distress.  Skin: Skin is warm and dry. No rash noted.   Cardiovascular: Normal heart rate noted  Respiratory: Normal respiratory effort, no problems with respiration noted  Abdomen: Soft, gravid, appropriate for gestational age.        Pelvic: {Blank single:19197::"Cervical exam performed in the presence of a chaperone","Cervical exam deferred"}        Extremities: Normal range of motion.     Mental Status: Normal mood and affect. Normal behavior. Normal judgment and thought content.   Assessment and Plan:  Pregnancy: [id number] at [redacted]w[redacted]d 1. Supervision of high risk pregnancy in third trimester ***  2. [redacted] weeks gestation of pregnancy ***  3. Orthostatic hypotension ***   {Blank single:19197::"Term","Preterm"} labor symptoms and general obstetric precautions including but not limited to vaginal bleeding, contractions, leaking of fluid and fetal movement were reviewed in detail with the patient. Please refer to After Visit Summary for other counseling recommendations.   No follow-ups on file.  Future Appointments  Date Time Provider Department Center  11/20/2023 10:15 AM Osborne Oman [facility]   12/19/2023  2:30 PM [facility] US1 [facility]  12/31/2023  8:00 AM Tobb, Lavona Mound, DO [facility] None    Bernerd Limbo, CNM

## 2023-11-20 ENCOUNTER — Ambulatory Visit (INDEPENDENT_AMBULATORY_CARE_PROVIDER_SITE_OTHER): Payer: Medicaid Other | Admitting: Certified Nurse Midwife

## 2023-11-20 ENCOUNTER — Other Ambulatory Visit: Payer: Self-pay

## 2023-11-20 VITALS — BP 101/67 | HR 101 | Wt 144.3 lb

## 2023-11-20 DIAGNOSIS — I951 Orthostatic hypotension: Secondary | ICD-10-CM

## 2023-11-20 DIAGNOSIS — Z3A32 32 weeks gestation of pregnancy: Secondary | ICD-10-CM

## 2023-11-20 DIAGNOSIS — O2653 Maternal hypotension syndrome, third trimester: Secondary | ICD-10-CM

## 2023-11-20 DIAGNOSIS — O0993 Supervision of high risk pregnancy, unspecified, third trimester: Secondary | ICD-10-CM

## 2023-11-20 NOTE — Progress Notes (Signed)
Dexcom G7 sample given: LOT [id number] EXP 2024-10-16  Demonstrated hand hygiene, cleaning of site, and application. Dexcom G7 app downloaded by patient, sensor linked, and patient account connected to office Clarity account.  [name] RN

## 2023-11-20 NOTE — Progress Notes (Signed)
   PRENATAL VISIT NOTE  Subjective:  Gina [name] is a 27 y.o. [phone] at [redacted]w[redacted]d being seen today for ongoing prenatal care.  She is currently monitored for the following issues for this low-risk pregnancy and has Orthostatic hypotension and Supervision of high risk pregnancy, antepartum on their problem list.  Patient reports  continued episodes of now near syncope as she is not working and can sit down when the spells happen. Frequency has not improved, but tolerance has. Has added electrolytes and increased salt intake without resolution .  Contractions: Irritability. Vag. Bleeding: None.  Movement: Present. Denies leaking of fluid.   The following portions of the patient's history were reviewed and updated as appropriate: allergies, current medications, past family history, past medical history, past social history, past surgical history and problem list.   Objective:   Vitals:   11/20/23 1043  BP: 101/67  Pulse: (!) 101  Weight: 144 lb 4.8 oz (65.5 kg)   Fetal Status: Fetal Heart Rate (bpm): 135   Movement: Present     General:  Alert, oriented and cooperative. Patient is in no acute distress.  Skin: Skin is warm and dry. No rash noted.   Cardiovascular: Normal heart rate noted  Respiratory: Normal respiratory effort, no problems with respiration noted  Abdomen: Soft, gravid, appropriate for gestational age.  Pain/Pressure: Absent     Pelvic: Cervical exam deferred        Extremities: Normal range of motion.  Edema: None  Mental Status: Normal mood and affect. Normal behavior. Normal judgment and thought content.   Assessment and Plan:  Pregnancy: [id number] at [redacted]w[redacted]d 1. Supervision of high risk pregnancy in third trimester - Doing well, feeling regular and vigorous fetal movement   2. [redacted] weeks gestation of pregnancy - Routine OB care  - Reviewed blood sugars from home surveillance, all fasting within normal range. All PP within normal range except one = 144 but this was taken  within an hour of eating. Stopped because all were within range and the QID testing was too much.  3. Orthostatic hypotension - Encouraged use of compression socks - Has been seen by OB Cardio who did not think these episodes are POTS. 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 Pt feels symptoms are most consistent with POTS and is concerned about autoimmune etiology. - Cardiology recommended improved hydration, offered IV hydration infusions to see if this helps her symptoms. Pt accepted. Will try one infusion and reassess. - Also offered to test vitamin D since deficiency is often linked to autoimmune issues in women - Discussed possibility that episodes are tied to hypoglycemia (was told this after two prior episodes). Offered Dexcom sample so she can see how her glucose reacts to her normal eating pattern and what her glucose levels are during these episodes. Additional 10 days will fulfill requirement for GDM screening.  Preterm labor symptoms and general obstetric precautions including but not limited to vaginal bleeding, contractions, leaking of fluid and fetal movement were reviewed in detail with the patient. Please refer to After Visit Summary for other counseling recommendations.   Return in about 2 weeks (around 12/04/2023) for IN-PERSON, LOB.  Future Appointments  Date Time Provider Department Center  12/04/2023 10:55 AM Osborne Oman [facility]  12/12/2023 10:55 AM Bernerd Limbo, CNM [facility]  12/19/2023  2:30 PM [facility] US1 [facility]  12/31/2023  8:00 AM Tobb, Lavona Mound, DO [facility] None    Bernerd Limbo, CNM

## 2023-11-21 LAB — VITAMIN D 25 HYDROXY (VIT D DEFICIENCY, FRACTURES): Vit D, 25-Hydroxy: 34.6 ng/mL (ref 30.0–100.0)

## 2023-12-04 ENCOUNTER — Other Ambulatory Visit (HOSPITAL_COMMUNITY)
Admission: RE | Admit: 2023-12-04 | Discharge: 2023-12-04 | Disposition: A | Discharge status: Home / Self Care | Payer: Medicaid Other | Source: Ambulatory Visit | Attending: Certified Nurse Midwife | Admitting: Certified Nurse Midwife

## 2023-12-04 ENCOUNTER — Encounter: Payer: Self-pay | Admitting: Certified Nurse Midwife

## 2023-12-04 ENCOUNTER — Other Ambulatory Visit: Payer: Self-pay

## 2023-12-04 ENCOUNTER — Ambulatory Visit: Payer: Medicaid Other | Admitting: Certified Nurse Midwife

## 2023-12-04 VITALS — BP 99/61 | HR 89 | Wt 148.0 lb

## 2023-12-04 DIAGNOSIS — I951 Orthostatic hypotension: Secondary | ICD-10-CM

## 2023-12-04 DIAGNOSIS — O0993 Supervision of high risk pregnancy, unspecified, third trimester: Secondary | ICD-10-CM

## 2023-12-04 DIAGNOSIS — Z3A34 34 weeks gestation of pregnancy: Secondary | ICD-10-CM

## 2023-12-04 DIAGNOSIS — R102 Pelvic and perineal pain: Secondary | ICD-10-CM

## 2023-12-04 DIAGNOSIS — O99413 Diseases of the circulatory system complicating pregnancy, third trimester: Secondary | ICD-10-CM

## 2023-12-04 DIAGNOSIS — N898 Other specified noninflammatory disorders of vagina: Secondary | ICD-10-CM

## 2023-12-04 DIAGNOSIS — O26893 Other specified pregnancy related conditions, third trimester: Secondary | ICD-10-CM

## 2023-12-04 NOTE — Progress Notes (Signed)
   PRENATAL VISIT NOTE  Subjective:  Gina [name] is a 27 y.o. [phone] at [redacted]w[redacted]d being seen today for ongoing prenatal care.  She is currently monitored for the following issues for this low-risk pregnancy and has Orthostatic hypotension and Supervision of high-risk pregnancy on their problem list.  Patient reports:  Contractions: Not present. Vag. Bleeding: None.  Movement: Present. Denies leaking of fluid.   Patient states she continues to have episodes of orthostasis; pressures range from 67/43-92/61 in sitting, squatting, and standing positions. One episode of syncope since last office visit in sitting position after prolonged standing, did not fall or hit head/stomach. Has not been contacted to get infusion as last prescribed. Has not tried compression socks. Was given CGM to monitor BG; two elevated readings in 140's, none in hypoglycemic range.   Previously referred to PFPT for back and pelvic pain. Received a call to schedule, but has not been able to contact them when she called back.  Having increased white vaginal discharge without itching, abnormal odor, burning, or pain.    The following portions of the patient's history were reviewed and updated as appropriate: allergies, current medications, past family history, past medical history, past social history, past surgical history and problem list.   Objective:   Vitals:   12/04/23 1204  BP: 99/61  Pulse: 89  Weight: 148 lb (67.1 kg)    Fetal Status: Fetal Heart Rate (bpm): 131 Fundal Height: 34 cm Movement: Present     General:  Alert, oriented and cooperative. Patient is in no acute distress.  Skin: Skin is warm and dry. No rash noted.   Cardiovascular: Normal heart rate noted  Respiratory: Normal respiratory effort, no problems with respiration noted  Abdomen: Soft, gravid, appropriate for gestational age.  Pain/Pressure: Present     Pelvic: Cervical exam deferred        Extremities: Normal range of motion.  Edema: None   Mental Status: Normal mood and affect. Normal behavior. Normal judgment and thought content.   Assessment and Plan:  Pregnancy: [id number] at [redacted]w[redacted]d 1. Supervision of high risk pregnancy in third trimester (Primary) - Patient stable; FHR and FH appropriate. Maternal vitals wnl  2. [redacted] weeks gestation of pregnancy -Anticipatory guidance about next visits/weeks of pregnancy given.   3. Orthostatic hypotension -Has been seen by [facility] cardiology with no cardiogenic etiology uncovered; plan is for repeat echocardiogram in postpartum period for reassessment. Seen by neurology who concluded likely POTS.  Pt will try IV infusion for symptomatic improvement. 10-day Dexcom readings revealed BG within normal range besides two hyperglycemic readings; does not seem to correlate with timing of orthostatic episodes (GDM screening fulfilled).  -Advised compression stockings, increased fluid intake, monitor symptoms at home.  -IV fluid infusion  4. Vaginal discharge - Cervicovaginal ancillary only ([place])   Preterm labor symptoms and general obstetric precautions including but not limited to vaginal bleeding, contractions, leaking of fluid and fetal movement were reviewed in detail with the patient. Please refer to After Visit Summary for other counseling recommendations.   Return in about 2 weeks (around 12/18/2023) for LOB.  Future Appointments  Date Time Provider Department Center  12/12/2023 10:55 AM Osborne Oman [facility]  12/19/2023  2:30 PM [facility] US1 [facility]  12/31/2023  8:00 AM Tobb, Lavona Mound, DO [facility] None    Ralene Muskrat, [place]

## 2023-12-06 ENCOUNTER — Encounter: Payer: Self-pay | Admitting: Certified Nurse Midwife

## 2023-12-06 LAB — CERVICOVAGINAL ANCILLARY ONLY
Bacterial Vaginitis (gardnerella): NEGATIVE
Candida Glabrata: NEGATIVE
Candida Vaginitis: NEGATIVE
Chlamydia: NEGATIVE
Comment: NEGATIVE
Comment: NEGATIVE
Comment: NEGATIVE
Comment: NEGATIVE
Comment: NEGATIVE
Comment: NORMAL
Neisseria Gonorrhea: NEGATIVE
Trichomonas: NEGATIVE

## 2023-12-11 NOTE — Progress Notes (Signed)
Pt had a homebirth prior to this visit, will follow up for postpartum care.

## 2023-12-12 ENCOUNTER — Ambulatory Visit: Payer: Medicaid Other | Admitting: Certified Nurse Midwife

## 2023-12-12 DIAGNOSIS — O0993 Supervision of high risk pregnancy, unspecified, third trimester: Secondary | ICD-10-CM

## 2023-12-12 DIAGNOSIS — Z3A35 35 weeks gestation of pregnancy: Secondary | ICD-10-CM

## 2023-12-12 DIAGNOSIS — I951 Orthostatic hypotension: Secondary | ICD-10-CM

## 2023-12-18 NOTE — Progress Notes (Signed)
   PRENATAL VISIT NOTE  Subjective:  Gina [name] is a 28 y.o. [phone] at [redacted]w[redacted]d being seen today for ongoing prenatal care.  She is currently monitored for the following issues for this low-risk pregnancy and has Orthostatic hypotension and Supervision of high-risk pregnancy on their problem list.  Patient reports  occasional practice contractions, continues to occasionally feel light-headed upon standing, etc. No syncopal episodes .  Contractions: Irritability. Vag. Bleeding: None.  Movement: Present. Denies leaking of fluid.   The following portions of the patient's history were reviewed and updated as appropriate: allergies, current medications, past family history, past medical history, past social history, past surgical history and problem list.   Objective:   Vitals:   12/19/23 1321  BP: 96/68  Pulse: (!) 106  Weight: 150 lb (68 kg)   Fetal Status: Fetal Heart Rate (bpm): 143 Fundal Height: 35 cm Movement: Present  Presentation: Vertex  General:  Alert, oriented and cooperative. Patient is in no acute distress.  Skin: Skin is warm and dry. No rash noted.   Cardiovascular: Normal heart rate noted  Respiratory: Normal respiratory effort, no problems with respiration noted  Abdomen: Soft, gravid, appropriate for gestational age.  Pain/Pressure: Present     Pelvic: Cervical exam deferred        Extremities: Normal range of motion.  Edema: None  Mental Status: Normal mood and affect. Normal behavior. Normal judgment and thought content.   Assessment and Plan:  Pregnancy: [id number] at [redacted]w[redacted]d 1. Supervision of high risk pregnancy in third trimester (Primary) - Doing well, feeling regular and vigorous fetal movement   2. [redacted] weeks gestation of pregnancy - Routine OB care including GBS testing today  3. Orthostatic hypotension - Still needs infusion - Review of Dexcom from 12/1-12/14 was normal, no readings >105 - Counseled that this may risk her out of waterbirth, depending on  midwife discretion and her status in labor. Expressed understanding, stated she is not tied to the idea of waterbirth  Preterm labor symptoms and general obstetric precautions including but not limited to vaginal bleeding, contractions, leaking of fluid and fetal movement were reviewed in detail with the patient. Please refer to After Visit Summary for other counseling recommendations.   Return in about 1 week (around 12/26/2023) for IN-PERSON, LOB.  Future Appointments  Date Time Provider Department Center  12/19/2023  2:30 PM [facility] US1 [facility]  12/31/2023  8:00 AM Tobb, Kardie, DO [facility] None  01/01/2024 10:15 AM Vannie Cornell SAUNDERS, CNM [facility]   Cornell SAUNDERS Vannie, CNM

## 2023-12-19 ENCOUNTER — Ambulatory Visit (INDEPENDENT_AMBULATORY_CARE_PROVIDER_SITE_OTHER): Payer: Medicaid Other | Admitting: Certified Nurse Midwife

## 2023-12-19 ENCOUNTER — Other Ambulatory Visit: Payer: Self-pay

## 2023-12-19 ENCOUNTER — Ambulatory Visit: Payer: Medicaid Other | Attending: Maternal & Fetal Medicine

## 2023-12-19 ENCOUNTER — Other Ambulatory Visit (HOSPITAL_COMMUNITY)
Admission: RE | Admit: 2023-12-19 | Discharge: 2023-12-19 | Disposition: A | Discharge status: Home / Self Care | Payer: Medicaid Other | Source: Ambulatory Visit | Attending: Certified Nurse Midwife | Admitting: Certified Nurse Midwife

## 2023-12-19 VITALS — BP 96/68 | HR 106 | Wt 150.0 lb

## 2023-12-19 DIAGNOSIS — O321XX0 Maternal care for breech presentation, not applicable or unspecified: Secondary | ICD-10-CM | POA: Diagnosis present

## 2023-12-19 DIAGNOSIS — E282 Polycystic ovarian syndrome: Secondary | ICD-10-CM | POA: Diagnosis not present

## 2023-12-19 DIAGNOSIS — O99283 Endocrine, nutritional and metabolic diseases complicating pregnancy, third trimester: Secondary | ICD-10-CM | POA: Diagnosis not present

## 2023-12-19 DIAGNOSIS — Z3A36 36 weeks gestation of pregnancy: Secondary | ICD-10-CM

## 2023-12-19 DIAGNOSIS — Z3493 Encounter for supervision of normal pregnancy, unspecified, third trimester: Secondary | ICD-10-CM | POA: Diagnosis present

## 2023-12-19 DIAGNOSIS — I951 Orthostatic hypotension: Secondary | ICD-10-CM

## 2023-12-19 DIAGNOSIS — O0993 Supervision of high risk pregnancy, unspecified, third trimester: Secondary | ICD-10-CM

## 2023-12-19 NOTE — Patient Instructions (Signed)
 The [employer]  This circuit takes at least 90 minutes to complete so clear your schedule and make mental preparations so you can relax in your environment. The second step requires a lot of pillows so gather them up before beginning Before starting, you should empty your bladder! Have a nice drink nearby, and make sure it has a straw! If you are having contractions, this circuit should be done through contractions, try not to change positions between steps Before you begin...  "I named this 'circuit' after my friend Gina [name], who shared and discussed it with me when I was working with a client whose labor seemed to be stalled out and no longer progressing... This circuit is useful to help get the baby lined up, ideally, in the "Left Occiput Anterior" (LOA) Position, both before labor begins and when some corrections need to be done during labor. Prenatally, this position set can help to rotate a baby. As a natural method of induction, this can help get things going if baby just needed a gentle nudge of position to set things off. To the best of my knowledge, this group of positions will not "hurt" a baby that is already lined up correctly." - Gina [name]   Step One: Open-knee Chest Stay in this position for 30 minutes, start in cat/cow, then drop your chest as low as you can to the bed or the floor and your bottom as high as you can. Knees should be fairly wide apart, and the angle between the torso/thighs should be wider than 90 degrees. Wiggle around, prop with lots of pillows and use this time to get totally relaxed. This position allows the baby to scoot out of the pelvis a bit and gives them room to rotate, shift their head position, etc. If the pregnant person finds it helpful, careful positioning with a rebozo under the belly, with gentle tension from a support person behind can help maintain this position for the full 30 minutes.  Step WGN:[id number] Left Side  Lying Roll to your left side, bringing your top leg as high as possible and keeping your bottom leg straight. Roll forward as much as possible, again using a lot of pillows. Sink into the bed and relax some more. If you fall asleep, that's totally okay and you can stay there! If not, stay here for at least another half an hour. Try and get your top right leg up towards your head and get as rolled over onto your belly as much as possible. If you repeat the circuit during labor, try alternating left and right sides. We know the photo the left is actually right side... just flip the image in your head.  Step Three: Moving and Lunges Lunge, walk stairs facing sideways, 2 at a time, (have a spotter downstairs of you!), take a walk outside with one foot on the curb and the other on the street, sit on a birth ball and hula- anything that's upright and putting your pelvis in open, asymmetrical positions. Spend at least 30 minutes doing this one as well to give your baby a chance to move down. If you are lunging or stair or curb walking, you should lunge/walk/go up stairs in the direction that feels better to you. The key with the lunge is that the toes of the higher leg and mom's belly button should be at right angles. Do not lunge over your knee, that closes the pelvis.     Gina [name]: Tourist information centre manager - [email or web address] Gina December  [name], CD, BDT (DONA), LCCE, FACCE: Supporting Content - [email or web address] Gina [name]: Photography - [email or web address] Gina [name] CD/CDT [facility]): Print and Webmaster - [email or web address] [name] [email or web address]

## 2023-12-19 NOTE — Addendum Note (Signed)
 Addended by: Marjo Bicker on: 12/19/2023 05:47 PM   Modules accepted: Orders

## 2023-12-20 LAB — CERVICOVAGINAL ANCILLARY ONLY
Chlamydia: NEGATIVE
Comment: NEGATIVE
Comment: NORMAL
Neisseria Gonorrhea: NEGATIVE

## 2023-12-24 LAB — CULTURE, BETA STREP (GROUP B ONLY): Strep Gp B Culture: NEGATIVE

## 2023-12-31 ENCOUNTER — Encounter: Payer: Self-pay | Admitting: Cardiology

## 2023-12-31 ENCOUNTER — Ambulatory Visit: Payer: Medicaid Other | Attending: Cardiology | Admitting: Cardiology

## 2023-12-31 VITALS — BP 98/54 | HR 71 | Ht 65.0 in | Wt 150.0 lb

## 2023-12-31 DIAGNOSIS — O99413 Diseases of the circulatory system complicating pregnancy, third trimester: Secondary | ICD-10-CM | POA: Diagnosis not present

## 2023-12-31 DIAGNOSIS — G90A Postural orthostatic tachycardia syndrome (POTS): Secondary | ICD-10-CM

## 2023-12-31 DIAGNOSIS — R42 Dizziness and giddiness: Secondary | ICD-10-CM

## 2023-12-31 DIAGNOSIS — Z3A37 37 weeks gestation of pregnancy: Secondary | ICD-10-CM

## 2023-12-31 DIAGNOSIS — O99353 Diseases of the nervous system complicating pregnancy, third trimester: Secondary | ICD-10-CM | POA: Diagnosis not present

## 2023-12-31 DIAGNOSIS — I371 Nonrheumatic pulmonary valve insufficiency: Secondary | ICD-10-CM

## 2023-12-31 NOTE — Progress Notes (Signed)
 Cardio-Obstetrics Clinic  New Evaluation  Date:  12/31/2023   ID:  Gina [name], DOB [birth year], MRN [id number]  PCP:  Patient, No Pcp Per   [facility] Providers Cardiologist:  Dub Huntsman, DO  Electrophysiologist:  None       Referring MD: No ref. provider found   Chief Complaint:  I am having lightheadedness  History of Present Illness:    Gina [name] is a 28 y.o. female [[id number]] who is being seen today in a follow up.  Medical hx includes Depression, status post tilt table test and was told she did not have POTS, moderate pulmonic regurgitation,   She is [redacted] weeks pregnant. She is has not had any symptoms relief since I saw here. Her blood pressure remains to shift. She has been experiencing worsening symptoms of orthostatic hypotension. She has been monitoring her blood pressure at home and has noted significant drops in blood pressure upon standing, leading to episodes of fainting. The most severe episode occurred on December 17th, when her blood pressure dropped to 67/43 mmHg upon standing, leading to a brief loss of consciousness. The patient has not yet obtained support stockings, which have been recommended to help manage her symptoms. She has also been in discussion with her midwife about the potential for hydration infusions, but this has not yet been arranged. The patient expresses a desire to understand the underlying cause of her symptoms and is hesitant about the potential need for medication.  Prior CV Studies Reviewed: The following studies were reviewed today: None   Past Medical History:  Diagnosis Date   Acute cystitis 05/21/2017   Candidal vulvovaginitis 06/27/2023   Complication of anesthesia    dental implant anes had to be repeated numberous times   Depression    Fatigue 06/27/2023   Headache    Irregular periods 06/27/2023   After Depo-Provera  use     Pain in joint of left shoulder 12/25/2022   Palpitations 06/27/2023   TSH pending  10/9     Postcoital bleeding 06/27/2023   G     POTS (postural orthostatic tachycardia syndrome)    Pregnancy 07/06/2017   Pyelonephritis    Restless leg syndrome    Syncope 05/21/2017    Past Surgical History:  Procedure Laterality Date   DENTAL SURGERY  2017   dental implant   NOSE SURGERY  2021   not sure of date   WISDOM TOOTH EXTRACTION Bilateral 2014      OB History     Gravida  4   Para  2   Term  2   Preterm      AB  1   Living  2      SAB  1   IAB      Ectopic      Multiple  0   Live Births  2               Current Medications: Current Meds  Medication Sig   Accu-Chek Softclix Lancets lancets Use as instructed; check blood glucose 4 times daily   glucose blood (ACCU-CHEK GUIDE) test strip Use as instructed; check blood glucose 4 times daily   Prenatal Vit-Fe Fumarate-FA (PRENATAL VITAMIN PO) Take 1 tablet by mouth daily at 6 (six) AM.     Allergies:   Patient has no known allergies.   Social History   Socioeconomic History   Marital status: Single    Spouse name: Not on file   Number of  children: Not on file   Years of education: Not on file   Highest education level: Not on file  Occupational History   Not on file  Tobacco Use   Smoking status: Never   Smokeless tobacco: Never  Vaping Use   Vaping status: Never Used  Substance and Sexual Activity   Alcohol use: Not Currently    Comment: not drank alcohlol for years   Drug use: Never   Sexual activity: Yes    Birth control/protection: None  Other Topics Concern   Not on file  Social History Narrative   Right handed   Social Drivers of Health   Financial Resource Strain: Not on file  Food Insecurity: Not on file  Transportation Needs: No Transportation Needs (08/13/2023)   PRAPARE - Transportation    Lack of Transportation (Medical): No    Lack of Transportation (Non-Medical): No  Physical Activity: Not on file  Stress: Not on file  Social Connections: Not on file       Family History  Problem Relation Age of Onset   Anxiety disorder Mother    Heart disease Brother    Migraines Neg Hx    Asthma Neg Hx    Cancer Neg Hx    Diabetes Neg Hx    Hypertension Neg Hx       ROS:   Please see the history of present illness.    Palpitations and lightheadedness  All other systems reviewed and are negative.   Labs/EKG Reviewed:    EKG:   EKG was ordered today.  The ekg ordered today demonstrates normal sinus rhythm HR 77 bpm  Recent Labs: 03/05/2023: TSH 2.07 10/31/2023: Hemoglobin 11.5; Platelets 281   Recent Lipid Panel No results found for: CHOL, TRIG, HDL, CHOLHDL, LDLCALC, LDLDIRECT  Physical Exam:    VS:  BP (!) 98/54 (BP Location: Left Arm, Patient Position: Sitting, Cuff Size: Normal)   Pulse 71   Ht 5' 5 (1.651 m)   Wt 150 lb (68 kg)   LMP 03/12/2023   BMI 24.96 kg/m     Wt Readings from Last 3 Encounters:  12/31/23 150 lb (68 kg)  12/19/23 150 lb (68 kg)  12/04/23 148 lb (67.1 kg)     GEN:  Well nourished, well developed in no acute distress HEENT: Normal NECK: No JVD; No carotid bruits LYMPHATICS: No lymphadenopathy CARDIAC: RRR, no murmurs, rubs, gallops RESPIRATORY:  Clear to auscultation without rales, wheezing or rhonchi  ABDOMEN: Soft, non-tender, non-distended MUSCULOSKELETAL:  No edema; No deformity  SKIN: Warm and dry NEUROLOGIC:  Alert and oriented x 3 PSYCHIATRIC:  Normal affect    Risk Assessment/Risk Calculators:                  ASSESSMENT & PLAN:    Palpitation Presyncope  Ectopic atrial rhythm Moderate Pulmonic regurgitation  PSVT PAC  Orthostatic Hypotension/Postural Orthostatic Tachycardia Syndrome (POTS) Symptoms of lightheadedness and fainting with positional changes. Documented significant drop in blood pressure upon standing. Currently [redacted] weeks pregnant. -She still needs to obtain support stockings to help manage symptoms during the remainder of the  pregnancy. -Possible diagnosis based on symptoms and blood pressure changes. Discussed potential need for medication postpartum to manage symptoms. -Consider medications such as Florinef or midodrine postpartum if symptoms persist and stockings are insufficient.  Pulmonic Regurgitation Plan to reassess 3-6 months postpartum. -Schedule echocardiogram 3-6 months postpartum.    General Health Maintenance / Followup Plans -Follow up 6 weeks postpartum. -Contact provider immediately postpartum  to assess need for earlier follow-up. -Coordinate with midwife for possible IV hydration therapy.   There are no Patient Instructions on file for this visit.   Dispo:  No follow-ups on file.   Medication Adjustments/Labs and Tests Ordered: Current medicines are reviewed at length with the patient today.  Concerns regarding medicines are outlined above.  Tests Ordered: No orders of the defined types were placed in this encounter.  Medication Changes: No orders of the defined types were placed in this encounter.

## 2023-12-31 NOTE — Patient Instructions (Signed)
 Medication Instructions:  Your physician recommends that you continue on your current medications as directed. Please refer to the Current Medication list given to you today.  *If you need a refill on your cardiac medications before your next appointment, please call your pharmacy*  Follow-Up: At [facility], you and your health needs are our priority.  As part of our continuing mission to provide you with exceptional heart care, we have created designated Provider Care Teams.  These Care Teams include your primary Cardiologist (physician) and Advanced Practice Providers (APPs -  Physician Assistants and Nurse Practitioners) who all work together to provide you with the care you need, when you need it.  Your next appointment:   2/25  Provider:   Kardie Tobb, DO     Other Instructions

## 2023-12-31 NOTE — Progress Notes (Signed)
   PRENATAL VISIT NOTE  Subjective:  Gina [name] is a 28 y.o. [phone] at [redacted]w[redacted]d being seen today for ongoing prenatal care.  She is currently monitored for the following issues for this high-risk pregnancy and has Orthostatic hypotension and Supervision of high-risk pregnancy on their problem list.  Patient reports  continued postural hypotension, saw OB Cardio yesterday and finally got a call from the IV infusion .  Contractions: Not present. Vag. Bleeding: None.  Movement: Present. Denies leaking of fluid.   The following portions of the patient's history were reviewed and updated as appropriate: allergies, current medications, past family history, past medical history, past social history, past surgical history and problem list.   Objective:   Vitals:   01/01/24 1121  BP: 100/72  Pulse: 84  Weight: 150 lb (68 kg)   Fetal Status: Fetal Heart Rate (bpm): 156 Fundal Height: 36 cm Movement: Present  Presentation: Vertex  General:  Alert, oriented and cooperative. Patient is in no acute distress.  Skin: Skin is warm and dry. No rash noted.   Cardiovascular: Normal heart rate noted  Respiratory: Normal respiratory effort, no problems with respiration noted  Abdomen: Soft, gravid, appropriate for gestational age.  Pain/Pressure: Present     Pelvic: Cervical exam deferred        Extremities: Normal range of motion.  Edema: None  Mental Status: Normal mood and affect. Normal behavior. Normal judgment and thought content.   Assessment and Plan:  Pregnancy: [id number] at [redacted]w[redacted]d 1. Supervision of high risk pregnancy in third trimester (Primary) - Doing well, feeling regular and vigorous fetal movement   2. [redacted] weeks gestation of pregnancy - Routine OB care   3. Orthostatic hypotension - Has IV infusion scheduled and follow up with Dr. Emmette Harms   Term labor symptoms and general obstetric precautions including but not limited to vaginal bleeding, contractions, leaking of fluid and fetal  movement were reviewed in detail with the patient. Please refer to After Visit Summary for other counseling recommendations.   Return in about 1 week (around 01/08/2024) for IN-PERSON, LOB.  Future Appointments  Date Time Provider Department Center  01/08/2024 10:15 AM Cleophas Dadds [facility]  02/11/2024 10:00 AM Tobb, Kardie, DO [facility] None   Derick Fleeting, CNM

## 2024-01-01 ENCOUNTER — Ambulatory Visit (INDEPENDENT_AMBULATORY_CARE_PROVIDER_SITE_OTHER): Payer: Medicaid Other | Admitting: Certified Nurse Midwife

## 2024-01-01 ENCOUNTER — Other Ambulatory Visit: Payer: Self-pay

## 2024-01-01 VITALS — BP 100/72 | HR 84 | Wt 150.0 lb

## 2024-01-01 DIAGNOSIS — O0993 Supervision of high risk pregnancy, unspecified, third trimester: Secondary | ICD-10-CM

## 2024-01-01 DIAGNOSIS — I951 Orthostatic hypotension: Secondary | ICD-10-CM

## 2024-01-01 DIAGNOSIS — Z3A38 38 weeks gestation of pregnancy: Secondary | ICD-10-CM

## 2024-01-06 ENCOUNTER — Ambulatory Visit: Payer: Medicaid Other

## 2024-01-08 ENCOUNTER — Encounter: Payer: Medicaid Other | Admitting: Certified Nurse Midwife

## 2024-01-08 ENCOUNTER — Telehealth: Payer: Self-pay

## 2024-01-08 ENCOUNTER — Ambulatory Visit: Payer: Medicaid Other

## 2024-01-08 NOTE — Telephone Encounter (Signed)
Patient cancelled routine OB visit today due to birth. Called pt to follow up. Reports birth at home. Both baby and patient are doing well. No concerns today. Plans to follow up at postpartum visit 02/05/24.

## 2024-02-05 ENCOUNTER — Encounter: Payer: Self-pay | Admitting: Certified Nurse Midwife

## 2024-02-05 ENCOUNTER — Other Ambulatory Visit: Payer: Self-pay

## 2024-02-05 ENCOUNTER — Ambulatory Visit: Payer: Medicaid Other | Admitting: Certified Nurse Midwife

## 2024-02-05 DIAGNOSIS — Z9189 Other specified personal risk factors, not elsewhere classified: Secondary | ICD-10-CM | POA: Diagnosis not present

## 2024-02-05 NOTE — Progress Notes (Unsigned)
Postpartum Visit Note  Gina [name] is a 28 y.o. [phone] female who presents for a postpartum visit. She is 4 weeks 5 days postpartum following a planned vaginal birth at home.  I have fully reviewed the prenatal and intrapartum course. The delivery was at [redacted]w[redacted]d.  Anesthesia: none. Postpartum course has been uncomplicated. Baby is doing well. Baby is feeding by breast. Bleeding staining only. Bowel function is abnormal: constipation . Bladder function is normal. Patient is not sexually active. Contraception method is none. Postpartum depression screening: negative.   Upstream - 02/05/24 1132       Pregnancy Intention Screening   Does the patient want to become pregnant in the next year? No    Does the patient's partner want to become pregnant in the next year? No    Would the patient like to discuss contraceptive options today? No            The pregnancy intention screening data noted above was reviewed. Potential methods of contraception were discussed. The patient elected to proceed with No data recorded.   Edinburgh Postnatal Depression Scale - 02/05/24 1132       Edinburgh Postnatal Depression Scale:  In the Past 7 Days   I have been able to laugh and see the funny side of things. 0    I have looked forward with enjoyment to things. 0    I have blamed myself unnecessarily when things went wrong. 2    I have been anxious or worried for no good reason. 2    I have felt scared or panicky for no good reason. 0    Things have been getting on top of me. 2    I have been so unhappy that I have had difficulty sleeping. 0    I have felt sad or miserable. 0    I have been so unhappy that I have been crying. 1    The thought of harming myself has occurred to me. 0    Edinburgh Postnatal Depression Scale Total 7            Health Maintenance Due  Topic Date Due   Pneumococcal Vaccine 3-27 Years old (1 of 2 - PCV) Never done   DTaP/Tdap/Td (1 - Tdap) Never done   Cervical  Cancer Screening (Pap smear)  Never done   INFLUENZA VACCINE  Never done   COVID-19 Vaccine (1 - 2024-25 season) Never done   The following portions of the patient's history were reviewed and updated as appropriate: allergies, current medications, past family history, past medical history, past social history, past surgical history, and problem list.  Review of Systems Pertinent items noted in HPI and remainder of comprehensive ROS otherwise negative.  Objective:  BP 104/71   Pulse 86   Wt 133 lb 8 oz (60.6 kg)   LMP 03/12/2023   Breastfeeding Yes   BMI 22.22 kg/m    General:  alert, cooperative, appears stated age, and no distress   Breasts:  normal  Lungs: Normal effort  Heart:  regular rate and rhythm  Abdomen: Soft, non-tender per pt    Wound N/A  GU exam:  not indicated       Assessment:  1. Postpartum care and examination (Primary) - Recovering well, fondly recalls birth and reviewed the video. Has recovered well.  2. Breastfeeding problem - Having a painful latch, baby gaining well - Ambulatory referral to Lactation   Plan:  Essential components of care per  ACOG recommendations:  1.  Mood and well being: Patient with negative depression screening today. Reviewed local resources for support.  - Patient tobacco use? No.   - hx of drug use? No.    2. Infant care and feeding:  -Patient currently breastmilk feeding? Yes, referred to [employer]  -Social determinants of health (SDOH) reviewed in EPIC. No concerns   3. Sexuality, contraception and birth spacing - Patient does not want a pregnancy in the next year.  Desired family size is 3 children.  - Reviewed reproductive life planning. Reviewed contraceptive methods based on pt preferences and effectiveness.  Patient desired FAM or LAM today.   - Discussed birth spacing of 18 months  4. Sleep and fatigue -Encouraged family/partner/community support of 4 hrs of uninterrupted sleep to help with mood and fatigue  5.  Physical Recovery  - Discussed patients delivery and complications. She describes her labor as good. - Patient had a planned home delivery with her husband and mother in attendance. It went well without any laceration. - Patient has urinary incontinence? No. - Patient is safe to resume physical and sexual activity  6.  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 12/31/22 was normal with +HPV16. Colpo without biopsy performed 02/02/23, needs repeat pap which we will do in one month. -Breast Cancer screening indicated? No.   7. Chronic Disease/Pregnancy Condition follow up: None - PCP follow up as needed  Future Appointments  Date Time Provider Department Center  02/11/2024 10:00 AM Tobb, Lavona [place], DO [facility] None  03/04/2024 10:15 AM Bernerd Limbo, CNM [facility]    Bernerd Limbo, CNM [facility] [employer], [facility]

## 2024-02-06 ENCOUNTER — Telehealth: Payer: Self-pay | Admitting: Lactation Services

## 2024-02-06 ENCOUNTER — Telehealth: Payer: Self-pay | Admitting: Certified Nurse Midwife

## 2024-02-06 NOTE — Telephone Encounter (Signed)
-----   Message from Nurse Faye Ramsay sent at 02/05/2024 12:35 PM EST ----- Regarding: lactation Will yall call her to check in? She is 4 weeks postpartum and having nipple pain with latch. Gina [name] offered lactation services but she didn't want to schedule immediately. She was open to chatting with yall to see if she could benefit from an appointment!  [name]

## 2024-02-06 NOTE — Telephone Encounter (Signed)
Calling [facility] per referral from RN stating mom has nipple pain with latch. Declined booking an appointment when offered in clinic yesterday, and open to lactation phone call and possible appointment.   Called twice, no answer, left message. Follow up as needed.  Juventino Slovak BSW IBCLC

## 2024-02-06 NOTE — Telephone Encounter (Signed)
Left a message for patient to call office to schedule with lactation consultant.

## 2024-02-07 NOTE — Telephone Encounter (Signed)
Calling for second attempt to offer mom with lactation support. Called one, no answer, left message. Follow up as needed.  Juventino Slovak BSW, IBCLC

## 2024-02-11 ENCOUNTER — Ambulatory Visit: Payer: Medicaid Other | Admitting: Cardiology

## 2024-03-04 ENCOUNTER — Ambulatory Visit: Payer: Medicaid Other | Admitting: Certified Nurse Midwife

## 2024-11-19 ENCOUNTER — Telehealth: Payer: Self-pay | Admitting: Neurology

## 2024-11-19 NOTE — Telephone Encounter (Signed)
 They can go to [facility] is they need a second opinion, otherwise we can see patient. Thanks

## 2024-11-19 NOTE — Telephone Encounter (Signed)
 Patient said, [facility] did a referral to [facility]. They said would have to call [facility] since a patient there. Patient said for 3 or 4 months have had recurrenting vertigo  that is very debilitating. Also having vestibular migraines. Would like a call back

## 2025-02-05 ENCOUNTER — Institutional Professional Consult (permissible substitution): Admitting: Neurology
